# Patient Record
Sex: Male | Born: 1960 | Hispanic: No | Marital: Married | Smoking: Never smoker
Health system: Southern US, Community
[De-identification: ages and names within clinical notes are randomized; demographics above are authoritative.]

## PROBLEM LIST (undated history)

## (undated) DIAGNOSIS — E785 Hyperlipidemia, unspecified: Secondary | ICD-10-CM

## (undated) DIAGNOSIS — E119 Type 2 diabetes mellitus without complications: Secondary | ICD-10-CM

## (undated) DIAGNOSIS — I10 Essential (primary) hypertension: Secondary | ICD-10-CM

## (undated) HISTORY — DX: Hyperlipidemia, unspecified: E78.5

---

## 2014-10-05 ENCOUNTER — Ambulatory Visit
Admission: RE | Admit: 2014-10-05 | Discharge: 2014-10-05 | Disposition: A | Discharge status: Home / Self Care | Payer: Medicaid Other | Source: Ambulatory Visit | Attending: Family Medicine | Admitting: Family Medicine

## 2014-10-05 ENCOUNTER — Other Ambulatory Visit: Payer: Self-pay | Admitting: Family Medicine

## 2014-10-05 DIAGNOSIS — M25541 Pain in joints of right hand: Secondary | ICD-10-CM

## 2016-04-23 LAB — BASIC METABOLIC PANEL: GLUCOSE: 143

## 2016-06-26 DIAGNOSIS — R197 Diarrhea, unspecified: Secondary | ICD-10-CM | POA: Insufficient documentation

## 2016-06-26 DIAGNOSIS — E860 Dehydration: Secondary | ICD-10-CM | POA: Insufficient documentation

## 2016-06-26 DIAGNOSIS — I10 Essential (primary) hypertension: Secondary | ICD-10-CM | POA: Insufficient documentation

## 2016-06-26 DIAGNOSIS — R112 Nausea with vomiting, unspecified: Secondary | ICD-10-CM | POA: Insufficient documentation

## 2016-06-26 DIAGNOSIS — R51 Headache: Secondary | ICD-10-CM | POA: Insufficient documentation

## 2016-06-26 DIAGNOSIS — M545 Low back pain: Secondary | ICD-10-CM | POA: Insufficient documentation

## 2016-06-26 DIAGNOSIS — E119 Type 2 diabetes mellitus without complications: Secondary | ICD-10-CM | POA: Insufficient documentation

## 2016-06-27 ENCOUNTER — Emergency Department (HOSPITAL_COMMUNITY)
Admission: EM | Admit: 2016-06-27 | Discharge: 2016-06-27 | Disposition: A | Discharge status: Home / Self Care | Payer: Medicaid Other | Attending: Emergency Medicine | Admitting: Emergency Medicine

## 2016-06-27 ENCOUNTER — Encounter (HOSPITAL_COMMUNITY): Payer: Self-pay | Admitting: Emergency Medicine

## 2016-06-27 DIAGNOSIS — E860 Dehydration: Secondary | ICD-10-CM

## 2016-06-27 DIAGNOSIS — R112 Nausea with vomiting, unspecified: Secondary | ICD-10-CM

## 2016-06-27 DIAGNOSIS — R197 Diarrhea, unspecified: Secondary | ICD-10-CM

## 2016-06-27 HISTORY — DX: Essential (primary) hypertension: I10

## 2016-06-27 HISTORY — DX: Type 2 diabetes mellitus without complications: E11.9

## 2016-06-27 LAB — CBC
HCT: 39.8 % (ref 39.0–52.0)
HEMOGLOBIN: 13 g/dL (ref 13.0–17.0)
MCH: 26.6 pg (ref 26.0–34.0)
MCHC: 32.7 g/dL (ref 30.0–36.0)
MCV: 81.4 fL (ref 78.0–100.0)
Platelets: 246 10*3/uL (ref 150–400)
RBC: 4.89 MIL/uL (ref 4.22–5.81)
RDW: 12.6 % (ref 11.5–15.5)
WBC: 8.1 10*3/uL (ref 4.0–10.5)

## 2016-06-27 LAB — COMPREHENSIVE METABOLIC PANEL
ALBUMIN: 4.1 g/dL (ref 3.5–5.0)
ALK PHOS: 60 U/L (ref 38–126)
ALT: 25 U/L (ref 17–63)
ANION GAP: 11 (ref 5–15)
AST: 26 U/L (ref 15–41)
BUN: 11 mg/dL (ref 6–20)
CALCIUM: 8.5 mg/dL — AB (ref 8.9–10.3)
CHLORIDE: 98 mmol/L — AB (ref 101–111)
CO2: 23 mmol/L (ref 22–32)
Creatinine, Ser: 0.77 mg/dL (ref 0.61–1.24)
GFR calc non Af Amer: >60 mL/min (ref >60)
GLUCOSE: 222 mg/dL — AB (ref 65–99)
Potassium: 4 mmol/L (ref 3.5–5.1)
Sodium: 132 mmol/L — ABNORMAL LOW (ref 135–145)
Total Bilirubin: 0.7 mg/dL (ref 0.3–1.2)
Total Protein: 6.9 g/dL (ref 6.5–8.1)

## 2016-06-27 LAB — URINALYSIS, ROUTINE W REFLEX MICROSCOPIC
BACTERIA UA: NONE SEEN
Bilirubin Urine: NEGATIVE
GLUCOSE, UA: NEGATIVE mg/dL
Hgb urine dipstick: NEGATIVE
KETONES UR: 20 mg/dL — AB
Leukocytes, UA: NEGATIVE
Nitrite: NEGATIVE
PROTEIN: 30 mg/dL — AB
SQUAMOUS EPITHELIAL / LPF: NONE SEEN
Specific Gravity, Urine: 1.027 (ref 1.005–1.030)
pH: 6 (ref 5.0–8.0)

## 2016-06-27 LAB — LIPASE, BLOOD: LIPASE: 55 U/L — AB (ref 11–51)

## 2016-06-27 MED ORDER — ONDANSETRON HCL 4 MG/2ML IJ SOLN
4.0000 mg | Freq: Once | INTRAMUSCULAR | Status: AC
  Administered 2016-06-27: 4 mg via INTRAVENOUS
  Filled 2016-06-27: qty 2

## 2016-06-27 MED ORDER — MORPHINE SULFATE (PF) 4 MG/ML IV SOLN
4.0000 mg | Freq: Once | INTRAVENOUS | Status: AC
  Administered 2016-06-27: 4 mg via INTRAVENOUS
  Filled 2016-06-27: qty 1

## 2016-06-27 MED ORDER — KETOROLAC TROMETHAMINE 30 MG/ML IJ SOLN
30.0000 mg | Freq: Once | INTRAMUSCULAR | Status: AC
  Administered 2016-06-27: 30 mg via INTRAVENOUS
  Filled 2016-06-27: qty 1

## 2016-06-27 MED ORDER — ONDANSETRON HCL 4 MG/2ML IJ SOLN: 4.0000 mg | Freq: Once | INTRAMUSCULAR | Status: DC

## 2016-06-27 MED ORDER — MORPHINE SULFATE (PF) 4 MG/ML IV SOLN: 4.0000 mg | Freq: Once | INTRAVENOUS | Status: DC

## 2016-06-27 MED ORDER — ONDANSETRON 8 MG PO TBDP: 8.0000 mg | ORAL_TABLET | Freq: Three times a day (TID) | ORAL | 0 refills | Status: DC | PRN

## 2016-06-27 MED ORDER — SODIUM CHLORIDE 0.9 % IV BOLUS (SEPSIS)
1000.0000 mL | Freq: Once | INTRAVENOUS | Status: AC
  Administered 2016-06-27: 1000 mL via INTRAVENOUS

## 2016-06-27 NOTE — ED Provider Notes (Signed)
[facility]-EMERGENCY DEPT Provider Note   CSN: [id number] Arrival date & time: 06/26/16  2356  By signing my name below, I, Thelma Barge, attest that this documentation has been prepared under the direction and in the presence of Azalia Bilis, MD. Electronically Signed: Thelma Barge, Scribe. 06/27/16. 2:11 AM.  History   Chief Complaint Chief Complaint  Patient presents with  . Abdominal Pain  . Emesis  . Diarrhea  . Headache  . Back Pain   The history is provided by the patient. No language interpreter was used.    HPI Comments: Todd Barnes is a 56 y.o. male who presents to the Emergency Department complaining of constant, gradually worsening abdominal pain since today. He has associated vomiting, diarrhea, back pain, and headache. He notes the pain is worse when he goes to the bathroom. He denies cough and hematemesis.  Past Medical History:  Diagnosis Date  . Diabetes mellitus without complication (HCC)   . Hypertension     There are no active problems to display for this patient.   History reviewed. No pertinent surgical history.     Home Medications    None  Family History No family history on file.  Social History Social History  Substance Use Topics  . Smoking status: Never Smoker  . Smokeless tobacco: Never Used  . Alcohol use No     Allergies   Patient has no known allergies.   Review of Systems Review of Systems  Respiratory: Negative for cough.   Gastrointestinal: Positive for abdominal pain, diarrhea, nausea and vomiting.  Musculoskeletal: Positive for back pain.  Neurological: Positive for headaches.  All other systems reviewed and are negative.    Physical Exam Updated Vital Signs BP 130/73   Pulse 99   Temp 99.6 F (37.6 C) (Oral)   Resp 16   SpO2 99%   Physical Exam  Constitutional: He is oriented to person, place, and time. He appears well-developed and well-nourished.  HENT:  Head: Normocephalic and atraumatic.  Eyes:  EOM are normal.  Neck: Normal range of motion.  Cardiovascular: Normal rate, regular rhythm, normal heart sounds and intact distal pulses.   Pulmonary/Chest: Effort normal and breath sounds normal. No respiratory distress.  Abdominal: Soft. He exhibits no distension. There is no tenderness.  Musculoskeletal: Normal range of motion.  Neurological: He is alert and oriented to person, place, and time.  Skin: Skin is warm and dry.  Psychiatric: He has a normal mood and affect. Judgment normal.  Nursing note and vitals reviewed.    ED Treatments / Results  DIAGNOSTIC STUDIES: Oxygen Saturation is 99% on RA, normal by my interpretation.    COORDINATION OF CARE: 2:10 AM Discussed treatment plan with pt at bedside and pt agreed to plan.  Labs (all labs ordered are listed, but only abnormal results are displayed) Labs Reviewed  LIPASE, BLOOD - Abnormal; Notable for the following:       Result Value   Lipase 55 (*)    All other components within normal limits  COMPREHENSIVE METABOLIC PANEL - Abnormal; Notable for the following:    Sodium 132 (*)    Chloride 98 (*)    Glucose, Bld 222 (*)    Calcium 8.5 (*)    All other components within normal limits  URINALYSIS, ROUTINE W REFLEX MICROSCOPIC - Abnormal; Notable for the following:    Ketones, ur 20 (*)    Protein, ur 30 (*)    All other components within normal limits  CBC  EKG  EKG Interpretation None       Radiology No results found.  Procedures Procedures (including critical care time)  Medications Ordered in ED Medications  sodium chloride 0.9 % bolus 1,000 mL (0 mLs Intravenous Stopped 06/27/16 0429)  ondansetron (ZOFRAN) injection 4 mg (4 mg Intravenous Given 06/27/16 0222)  ketorolac (TORADOL) 30 MG/ML injection 30 mg (30 mg Intravenous Given 06/27/16 0222)  morphine 4 MG/ML injection 4 mg (4 mg Intravenous Given 06/27/16 0222)     Initial Impression / Assessment and Plan / ED Course  I have reviewed the  triage vital signs and the nursing notes.  Pertinent labs & imaging results that were available during my care of the patient were reviewed by me and considered in my medical decision making (see chart for details).     Feels much better after IV fluids.  Likely viral illness given nausea vomiting diarrhea.  No abdominal tenderness on examination.  Repeat abdominal exam without tenderness  Final Clinical Impressions(s) / ED Diagnoses   Final diagnoses:  Nausea vomiting and diarrhea  Dehydration    New Prescriptions New Prescriptions   ONDANSETRON (ZOFRAN ODT) 8 MG DISINTEGRATING TABLET    Take 1 tablet (8 mg total) by mouth every 8 (eight) hours as needed for nausea or vomiting.  I personally performed the services described in this documentation, which was scribed in my presence. The recorded information has been reviewed and is accurate.        Azalia Bilisampos, Taylorann Tkach, MD 06/27/16 [id number]

## 2016-06-27 NOTE — ED Triage Notes (Signed)
Patient presents with multiple complaints : Mid abdominal pain with emesis / diarrhea today , fatigue , headache and low back pain , denies injury , no fever or chills . Arabic interpreter service utilized during triage .

## 2016-07-27 ENCOUNTER — Encounter: Payer: Self-pay | Admitting: Internal Medicine

## 2016-07-27 ENCOUNTER — Ambulatory Visit: Payer: Self-pay | Attending: Internal Medicine | Admitting: Internal Medicine

## 2016-07-27 VITALS — BP 158/94 | HR 88 | Temp 98.7°F | Wt 180.4 lb

## 2016-07-27 DIAGNOSIS — Z1211 Encounter for screening for malignant neoplasm of colon: Secondary | ICD-10-CM | POA: Insufficient documentation

## 2016-07-27 DIAGNOSIS — E785 Hyperlipidemia, unspecified: Secondary | ICD-10-CM | POA: Insufficient documentation

## 2016-07-27 DIAGNOSIS — IMO0001 Reserved for inherently not codable concepts without codable children: Secondary | ICD-10-CM

## 2016-07-27 DIAGNOSIS — E1165 Type 2 diabetes mellitus with hyperglycemia: Secondary | ICD-10-CM | POA: Insufficient documentation

## 2016-07-27 DIAGNOSIS — I10 Essential (primary) hypertension: Secondary | ICD-10-CM | POA: Insufficient documentation

## 2016-07-27 DIAGNOSIS — Z1159 Encounter for screening for other viral diseases: Secondary | ICD-10-CM | POA: Insufficient documentation

## 2016-07-27 DIAGNOSIS — Z23 Encounter for immunization: Secondary | ICD-10-CM | POA: Insufficient documentation

## 2016-07-27 LAB — GLUCOSE, POCT (MANUAL RESULT ENTRY): POC Glucose: 213 mg/dl — AB (ref 70–99)

## 2016-07-27 LAB — POCT GLYCOSYLATED HEMOGLOBIN (HGB A1C): Hemoglobin A1C: 9.1

## 2016-07-27 MED ORDER — SITAGLIPTIN PHOSPHATE 50 MG PO TABS: 50.0000 mg | ORAL_TABLET | Freq: Every day | ORAL | 6 refills | Status: DC

## 2016-07-27 MED ORDER — LISINOPRIL 10 MG PO TABS: 15.0000 mg | ORAL_TABLET | Freq: Every day | ORAL | 6 refills | Status: DC

## 2016-07-27 NOTE — Patient Instructions (Addendum)
Please have pt see Ms. Leavy Cella today before leaving. He has complete [employer] card form with him.   Increase lisinopril to 15 mg daily. Your blood sugar is not well controlled. We have added a medication called Januvia to take once a day  Continue healthy eating habits and regular exercise. Pneumococcal Polysaccharide Vaccine: What You Need to Know 1. Why get vaccinated? Vaccination can protect older adults (and some children and younger adults) from pneumococcal disease. Pneumococcal disease is caused by bacteria that can spread from person to person through close contact. It can cause ear infections, and it can also lead to more serious infections of the:  Lungs (pneumonia),  Blood (bacteremia), and  Covering of the brain and spinal cord (meningitis). Meningitis can cause deafness and brain damage, and it can be fatal.  Anyone can get pneumococcal disease, but children under 39 years of age, people with certain medical conditions, adults over 8 years of age, and cigarette smokers are at the highest risk. About 18,000 older adults die each year from pneumococcal disease in the [place]. Treatment of pneumococcal infections with penicillin and other drugs used to be more effective. But some strains of the disease have become resistant to these drugs. This makes prevention of the disease, through vaccination, even more important. 2. Pneumococcal polysaccharide vaccine (PPSV23) Pneumococcal polysaccharide vaccine (PPSV23) protects against 23 types of pneumococcal bacteria. It will not prevent all pneumococcal disease. PPSV23 is recommended for:  All adults 55 years of age and older,  Anyone 2 through 54 years of age with certain long-term health problems,  Anyone 2 through 54 years of age with a weakened immune system,  Adults 79 through 54 years of age who smoke cigarettes or have asthma.  Most people need only one dose of PPSV. A second dose is recommended for certain high-risk  groups. People 55 and older should get a dose even if they have gotten one or more doses of the vaccine before they turned 65. Your healthcare provider can give you more information about these recommendations. Most healthy adults develop protection within 2 to 3 weeks of getting the shot. 3. Some people should not get this vaccine  Anyone who has had a life-threatening allergic reaction to PPSV should not get another dose.  Anyone who has a severe allergy to any component of PPSV should not receive it. Tell your provider if you have any severe allergies.  Anyone who is moderately or severely ill when the shot is scheduled may be asked to wait until they recover before getting the vaccine. Someone with a mild illness can usually be vaccinated.  Children less than 34 years of age should not receive this vaccine.  There is no evidence that PPSV is harmful to either a pregnant woman or to her fetus. However, as a precaution, women who need the vaccine should be vaccinated before becoming pregnant, if possible. 4. Risks of a vaccine reaction With any medicine, including vaccines, there is a chance of side effects. These are usually mild and go away on their own, but serious reactions are also possible. About half of people who get PPSV have mild side effects, such as redness or pain where the shot is given, which go away within about two days. Less than 1 out of 100 people develop a fever, muscle aches, or more severe local reactions. Problems that could happen after any vaccine:  People sometimes faint after a medical procedure, including vaccination. Sitting or lying down for about 15 minutes  can help prevent fainting, and injuries caused by a fall. Tell your doctor if you feel dizzy, or have vision changes or ringing in the ears.  Some people get severe pain in the shoulder and have difficulty moving the arm where a shot was given. This happens very rarely.  Any medication can cause a severe  allergic reaction. Such reactions from a vaccine are very rare, estimated at about 1 in a million doses, and would happen within a few minutes to a few hours after the vaccination. As with any medicine, there is a very remote chance of a vaccine causing a serious injury or death. The safety of vaccines is always being monitored. For more information, visit: http://floyd.org/ 5. What if there is a serious reaction? What should I look for? Look for anything that concerns you, such as signs of a severe allergic reaction, very high fever, or unusual behavior. Signs of a severe allergic reaction can include hives, swelling of the face and throat, difficulty breathing, a fast heartbeat, dizziness, and weakness. These would usually start a few minutes to a few hours after the vaccination. What should I do? If you think it is a severe allergic reaction or other emergency that can't wait, call 9-1-1 or get to the nearest hospital. Otherwise, call your doctor. Afterward, the reaction should be reported to the Vaccine Adverse Event Reporting System (VAERS). Your doctor might file this report, or you can do it yourself through the VAERS web site at www.vaers.LAgents.no, or by calling 1-704-413-9903. VAERS does not give medical advice. 6. How can I learn more?  Ask your doctor. He or she can give you the vaccine package insert or suggest other sources of information.  Call your local or state health department.  Contact the Centers for Disease Control and Prevention (CDC): ? Call (503)551-3174 (1-800-CDC-INFO) or ? Visit CDC's website at PicCapture.uy CDC Pneumococcal Polysaccharide Vaccine VIS (04/24/13) This information is not intended to replace advice given to you by your health care provider. Make sure you discuss any questions you have with your health care provider. Document Released: 10/15/2005 Document Revised: 09/08/2015 Document Reviewed: 09/08/2015 Elsevier Interactive Patient  Education  2017 Elsevier Inc. Td Vaccine (Tetanus and Diphtheria): What You Need to Know 1. Why get vaccinated? Tetanus  and diphtheria are very serious diseases. They are rare in the [place] today, but people who do become infected often have severe complications. Td vaccine is used to protect adolescents and adults from both of these diseases. Both tetanus and diphtheria are infections caused by bacteria. Diphtheria spreads from person to person through coughing or sneezing. Tetanus-causing bacteria enter the body through cuts, scratches, or wounds. TETANUS (lockjaw) causes painful muscle tightening and stiffness, usually all over the body.  It can lead to tightening of muscles in the head and neck so you can't open your mouth, swallow, or sometimes even breathe. Tetanus kills about 1 out of every 10 people who are infected even after receiving the best medical care.  DIPHTHERIA can cause a thick coating to form in the back of the throat.  It can lead to breathing problems, paralysis, heart failure, and death.  Before vaccines, as many as 200,000 cases of diphtheria and hundreds of cases of tetanus were reported in the [place] each year. Since vaccination began, reports of cases for both diseases have dropped by about 99%. 2. Td vaccine Td vaccine can protect adolescents and adults from tetanus and diphtheria. Td is usually given as a booster dose  every 10 years but it can also be given earlier after a severe and dirty wound or burn. Another vaccine, called Tdap, which protects against pertussis in addition to tetanus and diphtheria, is sometimes recommended instead of Td vaccine. Your doctor or the person giving you the vaccine can give you more information. Td may safely be given at the same time as other vaccines. 3. Some people should not get this vaccine  A person who has ever had a life-threatening allergic reaction after a previous dose of any tetanus or diphtheria  containing vaccine, OR has a severe allergy to any part of this vaccine, should not get Td vaccine. Tell the person giving the vaccine about any severe allergies.  Talk to your doctor if you: ? had severe pain or swelling after any vaccine containing diphtheria or tetanus, ? ever had a condition called Guillain Barre Syndrome (GBS), ? aren't feeling well on the day the shot is scheduled. 4. What are the risks from Td vaccine? With any medicine, including vaccines, there is a chance of side effects. These are usually mild and go away on their own. Serious reactions are also possible but are rare. Most people who get Td vaccine do not have any problems with it. Mild problems following Td vaccine: (Did not interfere with activities)  Pain where the shot was given (about 8 people in 10)  Redness or swelling where the shot was given (about 1 person in 4)  Mild fever (rare)  Headache (about 1 person in 4)  Tiredness (about 1 person in 4)  Moderate problems following Td vaccine: (Interfered with activities, but did not require medical attention)  Fever over 102F (rare)  Severe problems following Td vaccine: (Unable to perform usual activities; required medical attention)  Swelling, severe pain, bleeding and/or redness in the arm where the shot was given (rare).  Problems that could happen after any vaccine:  People sometimes faint after a medical procedure, including vaccination. Sitting or lying down for about 15 minutes can help prevent fainting, and injuries caused by a fall. Tell your doctor if you feel dizzy, or have vision changes or ringing in the ears.  Some people get severe pain in the shoulder and have difficulty moving the arm where a shot was given. This happens very rarely.  Any medication can cause a severe allergic reaction. Such reactions from a vaccine are very rare, estimated at fewer than 1 in a million doses, and would happen within a few minutes to a few hours  after the vaccination. As with any medicine, there is a very remote chance of a vaccine causing a serious injury or death. The safety of vaccines is always being monitored. For more information, visit: http://floyd.org/www.cdc.gov/vaccinesafety/ 5. What if there is a serious reaction? What should I look for? Look for anything that concerns you, such as signs of a severe allergic reaction, very high fever, or unusual behavior. Signs of a severe allergic reaction can include hives, swelling of the face and throat, difficulty breathing, a fast heartbeat, dizziness, and weakness. These would usually start a few minutes to a few hours after the vaccination. What should I do?  If you think it is a severe allergic reaction or other emergency that can't wait, call 9-1-1 or get the person to the nearest hospital. Otherwise, call your doctor.  Afterward, the reaction should be reported to the Vaccine Adverse Event Reporting System (VAERS). Your doctor might file this report, or you can do it yourself through the  VAERS web site at www.vaers.LAgents.nohhs.gov, or by calling 1-548-244-2659. ? VAERS does not give medical advice. 6. The National Vaccine Injury Compensation Program The [employer] Vaccine Injury Compensation Program (VICP) is a federal program that was created to compensate people who may have been injured by certain vaccines. Persons who believe they may have been injured by a vaccine can learn about the program and about filing a claim by calling 1-310 134 7381 or visiting the VICP website at SpiritualWord.atwww.hrsa.gov/vaccinecompensation. There is a time limit to file a claim for compensation. 7. How can I learn more?  Ask your doctor. He or she can give you the vaccine package insert or suggest other sources of information.  Call your local or state health department.  Contact the Centers for Disease Control and Prevention (CDC): ? Call 856-401-43051-(705)275-4626 (1-800-CDC-INFO) ? Visit CDC's website at PicCapture.uywww.cdc.gov/vaccines CDC Td Vaccine  VIS (04/12/15) This information is not intended to replace advice given to you by your health care provider. Make sure you discuss any questions you have with your health care provider. Document Released: 10/15/2005 Document Revised: 09/08/2015 Document Reviewed: 09/08/2015 Elsevier Interactive Patient Education  2017 [employer]

## 2016-07-27 NOTE — Progress Notes (Signed)
Patient ID: Todd Barnes, male    DOB: Aug 05, 1960  MRN: [id number]  CC: No chief complaint on file.   Subjective: Todd Barnes is a 56 y.o. male who presents for new pt visit. 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  PCP was in [place] at a community health clinic  His concerns today include:  Hx DM, HTN and HL  1. DM BS range 135-140 in a.ms Eating habits: Does not cook as much but tries to eat healthy- Chicken, fish, veggies, fruits, wheat bread Exercise: walks and runs 3 x a wk.  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 No retinopathy. Wears reading glasses -no numbness -compliant with Metformin, and Amaryl  2. HTN -compliant with Lisinopril 10 mg -checks BP daily: SBP range 130s. Sometimes in 150s. -limits salt in foods -no CP/SOB/LE edema  3. HL -tolerating Lovastatin  HM: never had colonoscopy, Tdap and Pneumovax  No current outpatient prescriptions on file prior to visit.   No current facility-administered medications on file prior to visit.     No Known Allergies  Social History   Social History  . Marital status: Married    Spouse name: N/A  . Number of children: N/A  . Years of education: 5016   Occupational History  . house keeping    Social History Main Topics  . Smoking status: Never Smoker  . Smokeless tobacco: Never Used  . Alcohol use No  . Drug use: No  . Sexual activity: No   Other Topics Concern  . Not on file   Social History Narrative  . No narrative on file    Family History  Problem Relation Age of Onset  . Diabetes Mother     History reviewed. No pertinent surgical history.  ROS: Review of Systems  Constitutional: Negative for appetite change and fatigue.  Eyes: Negative for visual disturbance.  Respiratory: Negative for cough and shortness of breath.   Cardiovascular: Negative for chest pain, palpitations and leg swelling.    Gastrointestinal: Negative for abdominal pain.  Genitourinary: Negative for difficulty urinating.  Psychiatric/Behavioral: Negative for dysphoric mood.    PHYSICAL EXAM: BP (!) 158/94   Pulse 88   Temp 98.7 F (37.1 C) (Oral)   Wt 180 lb 6.4 oz (81.8 kg)   SpO2 99%   Physical Exam Constitutional: Appears well-developed and well-nourished. No distress. Head: Normocephalic. Atraumatic Eyes: Conjunctivae and EOM are normal. PERRLA, no scleral icterus.  Mouth: no oral lesions, good oral hygiene, throat clear without exudates Neck: Neck supple.  No tracheal deviation. No thyromegaly. No cervical LN CVS: RRR, S1/S2 +, no murmurs, no gallops, no carotid bruit. No JVD Pulmonary: Effort and breath sounds normal, no stridor, rhonchi, wheezes, rales.  Musculoskeletal: Normal range of motion. No edema and no tenderness.  Neuro: Alert and oriented x3.  Cns grossly intact, Power: 5/5 BL in all 4s.  Reflexes normal.  Normal  muscle tone, coordination. . Skin: Skin is warm and dry. No rash noted. Not diaphoretic. No erythema. No pallor.  Psychiatric: Normal mood and affect. Behavior, judgment, thought content normal.  Diabetic Foot Exam - Simple   Simple Foot Form Visual Inspection No deformities, no ulcerations, no other skin breakdown bilaterally:  Yes Sensation Testing Intact to touch and monofilament testing bilaterally:  Yes Pulse Check Posterior Tibialis and Dorsalis pulse intact bilaterally:  Yes Comments     Depression  screen PHQ 2/9 07/27/2016  Decreased Interest 0  Down, Depressed, Hopeless 0  PHQ - 2 Score 0   GAD 7 : Generalized Anxiety Score 07/27/2016  Nervous, Anxious, on Edge 0  Control/stop worrying 0  Worry too much - different things 0  Trouble relaxing 0  Restless 0  Easily annoyed or irritable 0  Afraid - awful might happen 0  Total GAD 7 Score 0    ASSESSMENT AND PLAN: 1. Uncontrolled type 2 diabetes mellitus without complication, without long-term current  use of insulin (HCC) -Recommend starting daily long-acting insulin. Patient declined and would prefer another oral agent. Start Januvia. Continue to encourage healthy eating and regular exercise. Last eye exam was 6 months ago by his report - POCT glucose (manual entry) - POCT glycosylated hemoglobin (Hb A1C) - Microalbumin/Creatinine Ratio, Urine - sitaGLIPtin (JANUVIA) 50 MG tablet; Take 1 tablet (50 mg total) by mouth daily.  Dispense: 30 tablet; Refill: 6 - CBC - Comprehensive metabolic panel - Lipid panel  2. Essential hypertension -Not at goal. Increase lisinopril to 15 mg daily. DASH diet discussed. - lisinopril (PRINIVIL,ZESTRIL) 10 MG tablet; Take 1.5 tablets (15 mg total) by mouth daily.  Dispense: 45 tablet; Refill: 6  3. Hyperlipidemia, unspecified hyperlipidemia type Continue lovastatin.  4. Colon cancer screening - Fecal occult blood, imunochemical  5. Need for Tdap vaccination - Tdap vaccine greater than or equal to 7yo IM  6. Need for vaccination for Strep pneumoniae - Pneumococcal polysaccharide vaccine 23-valent greater than or equal to 2yo subcutaneous/IM  7. Need for hepatitis C screening test - Hepatitis c antibody (reflex)   Patient was given the opportunity to ask questions.  Patient verbalized understanding of the plan and was able to repeat key elements of the plan.   Orders Placed This Encounter  Procedures  . Fecal occult blood, imunochemical  . Tdap vaccine greater than or equal to 7yo IM  . Pneumococcal polysaccharide vaccine 23-valent greater than or equal to 2yo subcutaneous/IM  . Microalbumin/Creatinine Ratio, Urine  . CBC  . Comprehensive metabolic panel  . Lipid panel  . Hepatitis c antibody (reflex)  . POCT glucose (manual entry)  . POCT glycosylated hemoglobin (Hb A1C)     Requested Prescriptions   Signed Prescriptions Disp Refills  . sitaGLIPtin (JANUVIA) 50 MG tablet 30 tablet 6    Sig: Take 1 tablet (50 mg total) by mouth  daily.  Marland Kitchen. lisinopril (PRINIVIL,ZESTRIL) 10 MG tablet 45 tablet 6    Sig: Take 1.5 tablets (15 mg total) by mouth daily.    Return in about 3 months (around 10/27/2016).  Jonah Blueeborah Johnson, MD, FACP

## 2016-07-28 LAB — COMPREHENSIVE METABOLIC PANEL
A/G RATIO: 1.7 (ref 1.2–2.2)
ALT: 12 IU/L (ref 0–44)
AST: 15 IU/L (ref 0–40)
Albumin: 4.7 g/dL (ref 3.5–5.5)
Alkaline Phosphatase: 77 IU/L (ref 39–117)
BILIRUBIN TOTAL: 0.2 mg/dL (ref 0.0–1.2)
BUN/Creatinine Ratio: 19 (ref 9–20)
BUN: 16 mg/dL (ref 6–24)
CHLORIDE: 101 mmol/L (ref 96–106)
CO2: 23 mmol/L (ref 20–29)
Calcium: 9.7 mg/dL (ref 8.7–10.2)
Creatinine, Ser: 0.84 mg/dL (ref 0.76–1.27)
GFR, EST AFRICAN AMERICAN: 115 mL/min/{1.73_m2} (ref >59)
GFR, EST NON AFRICAN AMERICAN: 99 mL/min/{1.73_m2} (ref >59)
GLOBULIN, TOTAL: 2.8 g/dL (ref 1.5–4.5)
Glucose: 197 mg/dL — ABNORMAL HIGH (ref 65–99)
POTASSIUM: 4.8 mmol/L (ref 3.5–5.2)
SODIUM: 139 mmol/L (ref 134–144)
TOTAL PROTEIN: 7.5 g/dL (ref 6.0–8.5)

## 2016-07-28 LAB — CBC
HEMATOCRIT: 41.1 % (ref 37.5–51.0)
Hemoglobin: 13.8 g/dL (ref 13.0–17.7)
MCH: 27.5 pg (ref 26.6–33.0)
MCHC: 33.6 g/dL (ref 31.5–35.7)
MCV: 82 fL (ref 79–97)
PLATELETS: 270 10*3/uL (ref 150–379)
RBC: 5.02 x10E6/uL (ref 4.14–5.80)
RDW: 13.9 % (ref 12.3–15.4)
WBC: 5.4 10*3/uL (ref 3.4–10.8)

## 2016-07-28 LAB — MICROALBUMIN / CREATININE URINE RATIO
CREATININE, UR: 59 mg/dL
Microalb/Creat Ratio: 6.1 mg/g creat (ref 0.0–30.0)
Microalbumin, Urine: 3.6 ug/mL

## 2016-07-28 LAB — HEPATITIS C ANTIBODY (REFLEX)

## 2016-07-28 LAB — LIPID PANEL
CHOL/HDL RATIO: 3.2 ratio (ref 0.0–5.0)
Cholesterol, Total: 118 mg/dL (ref 100–199)
HDL: 37 mg/dL — AB (ref >39)
LDL Calculated: 55 mg/dL (ref 0–99)
Triglycerides: 131 mg/dL (ref 0–149)
VLDL Cholesterol Cal: 26 mg/dL (ref 5–40)

## 2016-07-28 LAB — HCV COMMENT:

## 2016-07-31 ENCOUNTER — Ambulatory Visit: Payer: Self-pay | Attending: Internal Medicine

## 2016-08-06 ENCOUNTER — Telehealth: Payer: Self-pay

## 2016-08-06 NOTE — Telephone Encounter (Signed)
Contacted pt to go over lab results pt didn't answer lvm asking pt to give me a call at his earliest convenience   If pt calls back please give results: kidney and liver function tests, cholesterol levels are normal. Hep C screen is negative.

## 2016-08-10 ENCOUNTER — Ambulatory Visit: Payer: Self-pay | Attending: Internal Medicine

## 2016-08-29 ENCOUNTER — Ambulatory Visit: Payer: Medicaid Other

## 2016-09-05 ENCOUNTER — Ambulatory Visit: Payer: Self-pay | Attending: Internal Medicine

## 2016-10-03 ENCOUNTER — Ambulatory Visit: Payer: Self-pay

## 2016-10-05 ENCOUNTER — Ambulatory Visit: Payer: Self-pay

## 2016-10-18 ENCOUNTER — Ambulatory Visit: Payer: Self-pay | Attending: Internal Medicine

## 2016-10-19 ENCOUNTER — Ambulatory Visit: Payer: Medicaid Other | Attending: Internal Medicine

## 2016-10-26 ENCOUNTER — Other Ambulatory Visit: Payer: Self-pay

## 2016-10-26 DIAGNOSIS — IMO0001 Reserved for inherently not codable concepts without codable children: Secondary | ICD-10-CM

## 2016-10-26 DIAGNOSIS — E1165 Type 2 diabetes mellitus with hyperglycemia: Principal | ICD-10-CM

## 2016-10-26 MED ORDER — SITAGLIPTIN PHOSPHATE 50 MG PO TABS: 50.0000 mg | ORAL_TABLET | Freq: Every day | ORAL | 3 refills | Status: DC

## 2016-11-01 ENCOUNTER — Encounter: Payer: Self-pay | Admitting: Internal Medicine

## 2016-11-01 DIAGNOSIS — E785 Hyperlipidemia, unspecified: Secondary | ICD-10-CM | POA: Insufficient documentation

## 2016-11-01 DIAGNOSIS — E119 Type 2 diabetes mellitus without complications: Secondary | ICD-10-CM | POA: Insufficient documentation

## 2016-11-01 DIAGNOSIS — I10 Essential (primary) hypertension: Secondary | ICD-10-CM | POA: Insufficient documentation

## 2016-11-01 LAB — CBC AND DIFFERENTIAL: HEMOGLOBIN: 8.4 — AB (ref 13.5–17.5)

## 2017-01-09 ENCOUNTER — Ambulatory Visit: Payer: Medicaid Other | Attending: Internal Medicine

## 2017-03-12 ENCOUNTER — Ambulatory Visit: Payer: Self-pay

## 2017-04-04 ENCOUNTER — Encounter: Payer: Self-pay | Admitting: Internal Medicine

## 2017-04-04 ENCOUNTER — Ambulatory Visit: Payer: Self-pay | Attending: Internal Medicine | Admitting: Internal Medicine

## 2017-04-04 VITALS — BP 172/99 | HR 89 | Temp 98.8°F | Resp 16 | Wt 181.2 lb

## 2017-04-04 DIAGNOSIS — E119 Type 2 diabetes mellitus without complications: Secondary | ICD-10-CM

## 2017-04-04 DIAGNOSIS — Z79899 Other long term (current) drug therapy: Secondary | ICD-10-CM | POA: Insufficient documentation

## 2017-04-04 DIAGNOSIS — Z1211 Encounter for screening for malignant neoplasm of colon: Secondary | ICD-10-CM

## 2017-04-04 DIAGNOSIS — I10 Essential (primary) hypertension: Secondary | ICD-10-CM

## 2017-04-04 DIAGNOSIS — E785 Hyperlipidemia, unspecified: Secondary | ICD-10-CM

## 2017-04-04 DIAGNOSIS — Z7982 Long term (current) use of aspirin: Secondary | ICD-10-CM | POA: Insufficient documentation

## 2017-04-04 LAB — GLUCOSE, POCT (MANUAL RESULT ENTRY): POC Glucose: 186 mg/dL — AB (ref 70–99)

## 2017-04-04 LAB — POCT GLYCOSYLATED HEMOGLOBIN (HGB A1C): Hemoglobin A1C: 7.4

## 2017-04-04 MED ORDER — METFORMIN HCL 1000 MG PO TABS: 1000.0000 mg | ORAL_TABLET | Freq: Two times a day (BID) | ORAL | 2 refills | Status: DC

## 2017-04-04 MED ORDER — ATENOLOL 50 MG PO TABS: 50.0000 mg | ORAL_TABLET | Freq: Every day | ORAL | 2 refills | Status: DC

## 2017-04-04 MED ORDER — LOVASTATIN 40 MG PO TABS: 40.0000 mg | ORAL_TABLET | Freq: Every day | ORAL | 2 refills | Status: DC

## 2017-04-04 MED ORDER — GLIMEPIRIDE 4 MG PO TABS: 4.0000 mg | ORAL_TABLET | Freq: Two times a day (BID) | ORAL | 2 refills | Status: DC

## 2017-04-04 MED ORDER — SITAGLIPTIN PHOSPHATE 50 MG PO TABS: 50.0000 mg | ORAL_TABLET | Freq: Every day | ORAL | 3 refills | Status: DC

## 2017-04-04 MED ORDER — LISINOPRIL 20 MG PO TABS: 20.0000 mg | ORAL_TABLET | Freq: Every day | ORAL | 2 refills | Status: DC

## 2017-04-04 NOTE — Progress Notes (Signed)
Patient ID: Todd Barnes, male    DOB: [birth year]  MRN: [id number]  CC: Diabetes   Subjective: Todd Barnes is a 57 y.o. male who presents for chronic disease management His concerns today include:  Hx DM, HTN and HL  1.  DM:   Med: complaint with all 3 oral meds BS: checks BS several times a wk.  Before meals range 80-90; after meal 180-190 Diet:  Doing okay with eating habits.  Avoids sugary drinks Walks 30-60 mins 5 days a wk Due for eye exam.  No blurred vision No numbness in hands/feet  2.  HTN: check BP 3-4 x/wk.  Gives range of  SBP 130-140.  Compliant with Lisinopril and Atenolol.  NO CP/SOB/LE edema  3.  FIT: Did not to test as yet.  He was confused about the instructions.  No family history of colon cancer.  Patient Active Problem List   Diagnosis Date Noted  . Controlled type 2 diabetes mellitus without complication, without long-term current use of insulin (HCC) 11/01/2016  . Essential hypertension 11/01/2016  . Hyperlipidemia 11/01/2016     Current Outpatient Medications on File Prior to Visit  Medication Sig Dispense Refill  . aspirin EC 81 MG tablet Take 81 mg by mouth daily.     No current facility-administered medications on file prior to visit.     No Known Allergies  Social History   Socioeconomic History  . Marital status: Married    Spouse name: Not on file  . Number of children: Not on file  . Years of education: 22  . Highest education level: Not on file  Occupational History  . Occupation: house keeping  Social Needs  . Financial resource strain: Not on file  . Food insecurity:    Worry: Not on file    Inability: Not on file  . Transportation needs:    Medical: Not on file    Non-medical: Not on file  Tobacco Use  . Smoking status: Never Smoker  . Smokeless tobacco: Never Used  Substance and Sexual Activity  . Alcohol use: No  . Drug use: No  . Sexual activity: Never  Lifestyle  . Physical activity:    Days per  week: Not on file    Minutes per session: Not on file  . Stress: Not on file  Relationships  . Social connections:    Talks on phone: Not on file    Gets together: Not on file    Attends religious service: Not on file    Active member of club or organization: Not on file    Attends meetings of clubs or organizations: Not on file    Relationship status: Not on file  . Intimate partner violence:    Fear of current or ex partner: Not on file    Emotionally abused: Not on file    Physically abused: Not on file    Forced sexual activity: Not on file  Other Topics Concern  . Not on file  Social History Narrative  . Not on file    Family History  Problem Relation Age of Onset  . Diabetes Mother     No past surgical history on file.  ROS: Review of Systems Negative except as stated above PHYSICAL EXAM: BP (!) 172/99   Pulse 89   Temp 98.8 F (37.1 C) (Oral)   Resp 16   Wt 181 lb 3.2 oz (82.2 kg)   SpO2 99%   Wt Readings from Last 3  Encounters:  04/04/17 181 lb 3.2 oz (82.2 kg)  07/27/16 180 lb 6.4 oz (81.8 kg)  Repeat 160/90  Physical Exam  General appearance - alert, well appearing, and in no distress Mental status - alert, oriented to person, place, and time, normal mood, behavior, speech, dress, motor activity, and thought processes Neck - supple, no significant adenopathy Chest - clear to auscultation, no wheezes, rales or rhonchi, symmetric air entry Heart - normal rate, regular rhythm, normal S1, S2, no murmurs, rubs, clicks or gallops Extremities - peripheral pulses normal, no pedal edema, no clubbing or cyanosis  BS 186/A1C 7.4  ASSESSMENT AND PLAN: 1. Controlled type 2 diabetes mellitus without complication, without long-term current use of insulin (HCC) Much improved since last visit.  Continue metformin, Amaryl, and Januvia.  Continue healthy eating habits and regular exercise Patient encouraged to have eye exam done.  Probably [facility] would be the  cheapest place - POCT glucose (manual entry) - POCT glycosylated hemoglobin (Hb A1C) - metFORMIN (GLUCOPHAGE) 1000 MG tablet; Take 1 tablet (1,000 mg total) by mouth 2 (two) times daily with a meal.  Dispense: 180 tablet; Refill: 2 - glimepiride (AMARYL) 4 MG tablet; Take 1 tablet (4 mg total) by mouth 2 (two) times daily.  Dispense: 180 tablet; Refill: 2 - sitaGLIPtin (JANUVIA) 50 MG tablet; Take 1 tablet (50 mg total) by mouth daily.  Dispense: 90 tablet; Refill: 3  2. Essential hypertension Not at goal. Inc Lisinopril to 20 mg daily - lisinopril (PRINIVIL,ZESTRIL) 20 MG tablet; Take 1 tablet (20 mg total) by mouth daily.  Dispense: 90 tablet; Refill: 2 - atenolol (TENORMIN) 50 MG tablet; Take 1 tablet (50 mg total) by mouth daily.  Dispense: 90 tablet; Refill: 2  3. Hyperlipidemia, unspecified hyperlipidemia type - lovastatin (MEVACOR) 40 MG tablet; Take 1 tablet (40 mg total) by mouth at bedtime.  Dispense: 90 tablet; Refill: 2  4. Colon cancer screening CMA to give patient detailed instructions on how to do the fit test. - Fecal occult blood, imunochemical([facility])    Patient was given the opportunity to ask questions.  Patient verbalized understanding of the plan and was able to repeat key elements of the plan.   Orders Placed This Encounter  Procedures  . Fecal occult blood, imunochemical([facility])  . POCT glucose (manual entry)  . POCT glycosylated hemoglobin (Hb A1C)     Requested Prescriptions   Signed Prescriptions Disp Refills  . metFORMIN (GLUCOPHAGE) 1000 MG tablet 180 tablet 2    Sig: Take 1 tablet (1,000 mg total) by mouth 2 (two) times daily with a meal.  . lisinopril (PRINIVIL,ZESTRIL) 20 MG tablet 90 tablet 2    Sig: Take 1 tablet (20 mg total) by mouth daily.  Marland Kitchen. glimepiride (AMARYL) 4 MG tablet 180 tablet 2    Sig: Take 1 tablet (4 mg total) by mouth 2 (two) times daily.  Marland Kitchen. lovastatin (MEVACOR) 40 MG tablet 90 tablet 2    Sig: Take 1  tablet (40 mg total) by mouth at bedtime.  Marland Kitchen. atenolol (TENORMIN) 50 MG tablet 90 tablet 2    Sig: Take 1 tablet (50 mg total) by mouth daily.  . sitaGLIPtin (JANUVIA) 50 MG tablet 90 tablet 3    Sig: Take 1 tablet (50 mg total) by mouth daily.    Return in about 3 months (around 07/04/2017).  Jonah Blueeborah Major [place], MD, FACP

## 2017-04-04 NOTE — Patient Instructions (Signed)
Increase Lisinopril  to 40 mg daily.  You need to have an eye exam once a year.

## 2017-04-19 LAB — FECAL OCCULT BLOOD, IMMUNOCHEMICAL: Fecal Occult Bld: NEGATIVE

## 2017-04-22 ENCOUNTER — Telehealth: Payer: Self-pay | Admitting: Pharmacist

## 2017-04-22 MED ORDER — ATORVASTATIN CALCIUM 20 MG PO TABS: 20.0000 mg | ORAL_TABLET | Freq: Every day | ORAL | 3 refills | Status: DC

## 2017-04-22 NOTE — Addendum Note (Signed)
Addended by: Jonah BlueJOHNSON, DEBORAH B on: 04/22/2017 11:07 PM   Modules accepted: Orders

## 2017-04-22 NOTE — Telephone Encounter (Signed)
Patient does not have active Medicaid and therefore has no insurance. [facility] and simvastatin. Otherwise, would need to use [facility]. Will forward to PCP for review.

## 2017-04-24 ENCOUNTER — Telehealth: Payer: Self-pay

## 2017-04-24 NOTE — Telephone Encounter (Signed)
Contacted pt to go over colonguard results pt didn't answer and was unable to lvm  If pt calls back please give results: Cologuard test was negative which is good. Will repeat in 1 yr.

## 2017-08-27 ENCOUNTER — Ambulatory Visit: Payer: Self-pay | Attending: Family Medicine

## 2017-08-30 ENCOUNTER — Ambulatory Visit: Payer: Medicaid Other

## 2017-12-20 ENCOUNTER — Encounter: Payer: Self-pay | Admitting: Family Medicine

## 2018-01-04 ENCOUNTER — Encounter: Payer: Self-pay | Admitting: Family Medicine

## 2018-01-04 ENCOUNTER — Ambulatory Visit: Payer: Self-pay | Admitting: Family Medicine

## 2018-01-04 VITALS — BP 168/92 | HR 94 | Temp 98.4°F | Ht 69.0 in | Wt 184.4 lb

## 2018-01-04 DIAGNOSIS — E1165 Type 2 diabetes mellitus with hyperglycemia: Secondary | ICD-10-CM

## 2018-01-04 DIAGNOSIS — E119 Type 2 diabetes mellitus without complications: Secondary | ICD-10-CM

## 2018-01-04 DIAGNOSIS — I10 Essential (primary) hypertension: Secondary | ICD-10-CM

## 2018-01-04 MED ORDER — ATENOLOL 50 MG PO TABS: 50.0000 mg | ORAL_TABLET | Freq: Every day | ORAL | 2 refills | Status: DC

## 2018-01-04 MED ORDER — GLIMEPIRIDE 4 MG PO TABS: 4.0000 mg | ORAL_TABLET | Freq: Two times a day (BID) | ORAL | 2 refills | Status: DC

## 2018-01-04 MED ORDER — METFORMIN HCL 1000 MG PO TABS: 1000.0000 mg | ORAL_TABLET | Freq: Two times a day (BID) | ORAL | 2 refills | Status: DC

## 2018-01-04 MED ORDER — LISINOPRIL-HYDROCHLOROTHIAZIDE 20-12.5 MG PO TABS: ORAL_TABLET | ORAL | 3 refills | Status: DC

## 2018-01-04 MED ORDER — ATORVASTATIN CALCIUM 20 MG PO TABS: 20.0000 mg | ORAL_TABLET | Freq: Every day | ORAL | 3 refills | Status: DC

## 2018-01-04 NOTE — Progress Notes (Signed)
S, here to get refills on medicine  No complain O, Heart normal Feet normal Chest normal A, Uncontrolled type 2 diabetes mellitus with hyperglycemia (HCC)  Essential hypertension - Plan: atenolol (TENORMIN) 50 MG tablet  Controlled type 2 diabetes mellitus without complication, without long-term current use of insulin (HCC) - Plan: glimepiride (AMARYL) 4 MG tablet, metFORMIN (GLUCOPHAGE) 1000 MG tablet   Plan: Requested Prescriptions   Signed Prescriptions Disp Refills  . lisinopril-hydrochlorothiazide (ZESTORETIC) 20-12.5 MG tablet 180 tablet 3    Sig: 1 tab po bid  . atenolol (TENORMIN) 50 MG tablet 90 tablet 2    Sig: Take 1 tablet (50 mg total) by mouth daily.  Marland Kitchen atorvastatin (LIPITOR) 20 MG tablet 90 tablet 3    Sig: Take 1 tablet (20 mg total) by mouth daily.  Marland Kitchen glimepiride (AMARYL) 4 MG tablet 180 tablet 2    Sig: Take 1 tablet (4 mg total) by mouth 2 (two) times daily.  . metFORMIN (GLUCOPHAGE) 1000 MG tablet 180 tablet 2    Sig: Take 1 tablet (1,000 mg total) by mouth 2 (two) times daily with a meal.   Increase lisinopril to bid  Seem him 2 weeks

## 2018-01-27 ENCOUNTER — Other Ambulatory Visit: Payer: Self-pay | Admitting: Family Medicine

## 2018-01-28 LAB — BASIC METABOLIC PANEL
BUN/Creatinine Ratio: 16 (ref 9–20)
BUN: 13 mg/dL (ref 6–24)
CO2: 24 mmol/L (ref 20–29)
Calcium: 9.3 mg/dL (ref 8.7–10.2)
Chloride: 94 mmol/L — ABNORMAL LOW (ref 96–106)
Creatinine, Ser: 0.82 mg/dL (ref 0.76–1.27)
GFR calc Af Amer: 115 mL/min/{1.73_m2} (ref >59)
GFR calc non Af Amer: 100 mL/min/{1.73_m2} (ref >59)
Glucose: 211 mg/dL — ABNORMAL HIGH (ref 65–99)
Potassium: 4.8 mmol/L (ref 3.5–5.2)
Sodium: 136 mmol/L (ref 134–144)

## 2018-01-28 LAB — LIPID PANEL WITH LDL/HDL RATIO
Cholesterol, Total: 164 mg/dL (ref 100–199)
HDL: 33 mg/dL — ABNORMAL LOW (ref >39)
LDL Calculated: 89 mg/dL (ref 0–99)
LDl/HDL Ratio: 2.7 ratio (ref 0.0–3.6)
Triglycerides: 208 mg/dL — ABNORMAL HIGH (ref 0–149)
VLDL Cholesterol Cal: 42 mg/dL — ABNORMAL HIGH (ref 5–40)

## 2018-01-28 LAB — TSH: TSH: 3.78 u[IU]/mL (ref 0.450–4.500)

## 2018-02-01 ENCOUNTER — Encounter: Payer: Self-pay | Admitting: Adult Health

## 2018-02-01 ENCOUNTER — Ambulatory Visit: Payer: Self-pay | Admitting: Internal Medicine

## 2018-02-01 VITALS — BP 143/94 | HR 78 | Temp 97.3°F | Ht 68.5 in | Wt 182.8 lb

## 2018-02-01 DIAGNOSIS — E119 Type 2 diabetes mellitus without complications: Secondary | ICD-10-CM

## 2018-02-01 DIAGNOSIS — Z0100 Encounter for examination of eyes and vision without abnormal findings: Secondary | ICD-10-CM

## 2018-02-01 DIAGNOSIS — E1169 Type 2 diabetes mellitus with other specified complication: Secondary | ICD-10-CM

## 2018-02-01 DIAGNOSIS — I10 Essential (primary) hypertension: Secondary | ICD-10-CM

## 2018-02-01 MED ORDER — LOVASTATIN 20 MG PO TABS: 20.0000 mg | ORAL_TABLET | Freq: Every day | ORAL | 3 refills | Status: DC

## 2018-02-01 MED ORDER — SITAGLIPTIN PHOS-METFORMIN HCL 50-500 MG PO TABS: 1.0000 | ORAL_TABLET | Freq: Two times a day (BID) | ORAL | 2 refills | Status: DC

## 2018-02-01 NOTE — Progress Notes (Signed)
   Office Visit Note   Patient: Todd Barnes           Date of Birth: [birth year]           MRN: [id number] Visit Date: 02/01/2018              Requested by: Marcine MatarJohnson, Deborah B, MD [address] [place], [place] [id number] PCP: Marcine MatarJohnson, Deborah B, MD   Assessment & Plan:  Visit Diagnoses: Diabetes , hypertension follow-up  Last visit reviewed Follow-Up Instructions: Walk daily for exercise, ADA diet.   Orders:   Medications refilled.   Procedures: None    Subjective: Chief Complaint  Patient presents with  . Follow-up    HPI: Diabetes follow-up  Review of Systems Negative.   Objective: Vital Signs: BP (!) 143/94 (BP Location: Right Arm, Patient Position: Sitting, Cuff Size: Normal)   Pulse 78   Temp (!) 97.3 F (36.3 C) (Oral)   Ht 5' 8.5" (1.74 m)   Wt 182 lb 12.8 oz (82.9 kg)   SpO2 98%   BMI 27.39 kg/m   Physical Exam Unremarkable.   Specialty Comments:  No specialty comments available.  Imaging: No results found.   PMFS History: Patient Active Problem List   Diagnosis Date Noted  . Controlled type 2 diabetes mellitus without complication, without long-term current use of insulin (HCC) 11/01/2016  . Essential hypertension 11/01/2016  . Hyperlipidemia 11/01/2016   Past Medical History:  Diagnosis Date  . Diabetes mellitus without complication (HCC)   . Hypertension     Family History  Problem Relation Age of Onset  . Diabetes Mother     History reviewed. No pertinent surgical history. Social History   Occupational History  . Occupation: house keeping  Tobacco Use  . Smoking status: Never Smoker  . Smokeless tobacco: Never Used  Substance and Sexual Activity  . Alcohol use: No  . Drug use: No  . Sexual activity: Never

## 2018-02-28 NOTE — Addendum Note (Signed)
Addended by: Annamaria Helling on: 02/28/2018 08:02 PM   Modules accepted: Orders

## 2018-02-28 NOTE — Addendum Note (Signed)
Addended by: Annamaria Helling on: 02/28/2018 08:33 PM   Modules accepted: Orders

## 2018-05-03 ENCOUNTER — Ambulatory Visit: Payer: Self-pay | Admitting: Adult Health

## 2018-10-10 ENCOUNTER — Ambulatory Visit: Payer: Self-pay | Attending: Family Medicine

## 2018-10-10 ENCOUNTER — Other Ambulatory Visit: Payer: Self-pay

## 2018-10-25 ENCOUNTER — Other Ambulatory Visit: Payer: Self-pay

## 2018-11-01 ENCOUNTER — Other Ambulatory Visit: Payer: Self-pay

## 2018-11-01 ENCOUNTER — Telehealth: Payer: Self-pay | Admitting: Internal Medicine

## 2018-11-01 DIAGNOSIS — E119 Type 2 diabetes mellitus without complications: Secondary | ICD-10-CM

## 2018-11-01 DIAGNOSIS — I10 Essential (primary) hypertension: Secondary | ICD-10-CM

## 2018-11-01 DIAGNOSIS — M25511 Pain in right shoulder: Secondary | ICD-10-CM

## 2018-11-01 DIAGNOSIS — E785 Hyperlipidemia, unspecified: Secondary | ICD-10-CM

## 2018-11-01 MED ORDER — GLIMEPIRIDE 4 MG PO TABS: 4.0000 mg | ORAL_TABLET | Freq: Two times a day (BID) | ORAL | 2 refills | Status: DC

## 2018-11-01 MED ORDER — ATENOLOL 50 MG PO TABS: 50.0000 mg | ORAL_TABLET | Freq: Every day | ORAL | 3 refills | Status: DC

## 2018-11-01 NOTE — Progress Notes (Signed)
Acute Office Visit  Subjective:    Patient ID: Todd Barnes, male    DOB: 05-29-1960, 58 y.o.   MRN: [id number]  No chief complaint on file.   HPI Patient is in today for intermittent right shoulder pain and medicine refills. Has tried Ibuprofen for pain which helps. No trauma, heavy lifting since has been off work due to [employer].  Past Medical History:  Diagnosis Date  . Diabetes mellitus without complication ([place])   . Hypertension     No past surgical history on file.  Family History  Problem Relation Age of Onset  . Diabetes Mother     Social History   Socioeconomic History  . Marital status: Married    Spouse name: Not on file  . Number of children: Not on file  . Years of education: 37  . Highest education level: Not on file  Occupational History  . Occupation: house keeping  Social Needs  . Financial resource strain: Not on file  . Food insecurity    Worry: Not on file    Inability: Not on file  . Transportation needs    Medical: Not on file    Non-medical: Not on file  Tobacco Use  . Smoking status: Never Smoker  . Smokeless tobacco: Never Used  Substance and Sexual Activity  . Alcohol use: No  . Drug use: No  . Sexual activity: Never  Lifestyle  . Physical activity    Days per week: Not on file    Minutes per session: Not on file  . Stress: Not on file  Relationships  . Social Herbalist on phone: Not on file    Gets together: Not on file    Attends religious service: Not on file    Active member of club or organization: Not on file    Attends meetings of clubs or organizations: Not on file    Relationship status: Not on file  . Intimate partner violence    Fear of current or ex partner: Not on file    Emotionally abused: Not on file    Physically abused: Not on file    Forced sexual activity: Not on file  Other Topics Concern  . Not on file  Social History Narrative  . Not on file    Outpatient Medications Prior to  Visit  Medication Sig Dispense Refill  . aspirin EC 81 MG tablet Take 81 mg by mouth daily.    Marland Kitchen atenolol (TENORMIN) 50 MG tablet Take 1 tablet (50 mg total) by mouth daily. (Patient taking differently: Take 50 mg by mouth daily. ) 90 tablet 2  . atorvastatin (LIPITOR) 20 MG tablet Take 1 tablet (20 mg total) by mouth daily. 90 tablet 3  . glimepiride (AMARYL) 4 MG tablet Take 1 tablet (4 mg total) by mouth 2 (two) times daily. 180 tablet 2  . lisinopril-hydrochlorothiazide (ZESTORETIC) 20-12.5 MG tablet 1 tab po bid 180 tablet 3  . lovastatin (MEVACOR) 20 MG tablet Take 1 tablet (20 mg total) by mouth at bedtime. (Patient not taking: Reported on 02/01/2018) 90 tablet 3  . sitaGLIPtin-metformin (JANUMET) 50-500 MG tablet Take 1 tablet by mouth 2 (two) times daily with a meal. 160 tablet 2   No facility-administered medications prior to visit.     No Known Allergies  ROS As above, all others nagative    Objective:    Physical Exam  There were no vitals taken for this visit. Wt Readings from Last  3 Encounters:  02/01/18 182 lb 12.8 oz (82.9 kg)  01/04/18 184 lb 6.4 oz (83.6 kg)  04/03/16 180 lb 6.4 oz (81.8 kg)    Health Maintenance Due  Topic Date Due  . HIV Screening  06/14/1977  . OPHTHALMOLOGY EXAM  02/02/2017  . FOOT EXAM  07/27/2017  . HEMOGLOBIN A1C  10/04/2017  . INFLUENZA VACCINE  08/02/2018    There are no preventive care reminders to display for this patient.   Lab Results  Component Value Date   TSH 3.780 01/27/2018   Lab Results  Component Value Date   WBC 5.4 07/27/2016   HGB 8.4 (A) 11/01/2016   HCT 41.1 07/27/2016   MCV 82 07/27/2016   PLT 270 07/27/2016   Lab Results  Component Value Date   NA 136 01/27/2018   K 4.8 01/27/2018   CO2 24 01/27/2018   GLUCOSE 211 (H) 01/27/2018   BUN 13 01/27/2018   CREATININE 0.82 01/27/2018   BILITOT 0.2 07/27/2016   ALKPHOS 77 07/27/2016   AST 15 07/27/2016   ALT 12 07/27/2016   PROT 7.5 07/27/2016    ALBUMIN 4.7 07/27/2016   CALCIUM 9.3 01/27/2018   ANIONGAP 11 06/27/2016   Lab Results  Component Value Date   CHOL 164 01/27/2018   Lab Results  Component Value Date   HDL 33 (L) 01/27/2018   Lab Results  Component Value Date   LDLCALC 89 01/27/2018   Lab Results  Component Value Date   TRIG 208 (H) 01/27/2018   Lab Results  Component Value Date   CHOLHDL 3.2 07/27/2016   Lab Results  Component Value Date   HGBA1C 7.4 04/04/2017       Assessment & Plan:   Problem List Items Addressed This Visit    None     - Ibuprofen 400 mg BID for 5 days -Amaryl and Tenormin refills - F/U in 1 week  No orders of the defined types were placed in this encounter.    Kirt Boys, MD

## 2018-11-07 ENCOUNTER — Encounter: Payer: Self-pay | Admitting: Internal Medicine

## 2018-11-07 ENCOUNTER — Ambulatory Visit: Payer: Self-pay | Attending: Internal Medicine | Admitting: Internal Medicine

## 2018-11-07 ENCOUNTER — Other Ambulatory Visit: Payer: Self-pay

## 2018-11-07 VITALS — BP 137/81 | HR 88 | Temp 99.6°F | Resp 16 | Ht 68.0 in | Wt 185.2 lb

## 2018-11-07 DIAGNOSIS — E785 Hyperlipidemia, unspecified: Secondary | ICD-10-CM

## 2018-11-07 DIAGNOSIS — I10 Essential (primary) hypertension: Secondary | ICD-10-CM

## 2018-11-07 DIAGNOSIS — M25511 Pain in right shoulder: Secondary | ICD-10-CM

## 2018-11-07 DIAGNOSIS — E119 Type 2 diabetes mellitus without complications: Secondary | ICD-10-CM

## 2018-11-07 DIAGNOSIS — G8929 Other chronic pain: Secondary | ICD-10-CM

## 2018-11-07 LAB — GLUCOSE, POCT (MANUAL RESULT ENTRY): POC Glucose: 386 mg/dl — AB (ref 70–99)

## 2018-11-07 LAB — POCT GLYCOSYLATED HEMOGLOBIN (HGB A1C): HbA1c, POC (controlled diabetic range): 10.6 % — AB (ref 0.0–7.0)

## 2018-11-07 MED ORDER — LOVASTATIN 20 MG PO TABS: 20.0000 mg | ORAL_TABLET | Freq: Every day | ORAL | 3 refills | Status: DC

## 2018-11-07 MED ORDER — METFORMIN HCL 1000 MG PO TABS: ORAL_TABLET | ORAL | 3 refills | Status: DC

## 2018-11-07 NOTE — Progress Notes (Signed)
Patient ID: Kaulder Zahner, male    DOB: [birth year]  MRN: [id number]  CC: Diabetes and Hypertension   Subjective: Todd Barnes is a 58 y.o. male who presents for chronic ds management.  I last saw him in the spring of last year.  He was seeing another [facility] practice but returned to [place] due to lack of insurance His concerns today include:  Hx DM, HTN and HL  DIABETES TYPE 2 Last A1C:   Results for orders placed or performed in visit on 11/07/18  Glucose (CBG)  Result Value Ref Range   POC Glucose 386 (A) 70 - 99 mg/dl  HgB C1E  Result Value Ref Range   Hemoglobin A1C     HbA1c POC (<> result, manual entry)     HbA1c, POC (prediabetic range)     HbA1c, POC (controlled diabetic range) 10.6 (A) 0.0 - 7.0 %    Med Adherence:  [x]  Yes -on Janumet and Amaryl   []  No Medication side effects:  []  Yes    []  No Home Monitoring?  [x]  Yes  - in the mornings Home glucose results range: Before BF in 150s Diet Adherence: eats small portions and no sugar stuff Exercise: [x]  Yes  -walking 2 x a wk for 30 mins Hypoglycemic episodes?: []  Yes    [x]  No Numbness of the feet? []  Yes    [x]  No Retinopathy hx? []  Yes    [x]  No Last eye exam: no blurred vision. Over due for eye exam Comments:   HYPERTENSION Currently taking: see medication list -he is on atenolol and lisinopril/HCTZ. Med Adherence: [x]  Yes    []  No Medication side effects: []  Yes    [x]  No Adherence with salt restriction: []  Yes    [x]  No Home Monitoring?: [x]  Yes  In mornings Monitoring Frequency:  Home BP results range: reports SBP in the 120-140.  Does not recall DBP SOB? []  Yes    [x]  No Chest Pain?: []  Yes    [x]  No Leg swelling?: []  Yes    [x]  No Headaches?: []  Yes    [x]  No Dizziness? []  Yes    [x]  No Comments:    HL: He is on lovastatin.  He would like this prescription to be sent to our pharmacy as it would be cheaper for him  C/o pain in RT shoulder x 3 mths.  No initiating factors.  States that it  throbs mainly over the posterior joint.  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  Patient Active Problem List   Diagnosis Date Noted  . Controlled type 2 diabetes mellitus without complication, without long-term current use of insulin (HCC) 11/01/2016  . Essential hypertension 11/01/2016  . Hyperlipidemia 11/01/2016     Current Outpatient Medications on File Prior to Visit  Medication Sig Dispense Refill  . aspirin EC 81 MG tablet Take 81 mg by mouth daily.    atenolol (TENORMIN) 50 MG tablet Take 1 tablet (50 mg total) by mouth daily. (Patient taking differently: Take 50 mg by mouth daily. ) 90 tablet 2  . atenolol (TENORMIN) 50 MG tablet Take 1 tablet (50 mg total) by mouth daily. 90 tablet 3  . atorvastatin (LIPITOR) 20 MG tablet Take 1 tablet (20 mg total) by mouth daily. 90 tablet 3  . glimepiride (AMARYL) 4 MG tablet Take  1 tablet (4 mg total) by mouth 2 (two) times daily. 180 tablet 2  . lisinopril-hydrochlorothiazide (ZESTORETIC) 20-12.5 MG tablet 1 tab po bid 180 tablet 3  . sitaGLIPtin-metformin (JANUMET) 50-500 MG tablet Take 1 tablet by mouth 2 (two) times daily with a meal. 160 tablet 2   No current facility-administered medications on file prior to visit.     No Known Allergies  Social History   Socioeconomic History  . Marital status: Married    Spouse name: Not on file  . Number of children: Not on file  . Years of education: 59  . Highest education level: Not on file  Occupational History  . Occupation: house keeping  Social Needs  . Financial resource strain: Not on file  . Food insecurity    Worry: Not on file    Inability: Not on file  . Transportation needs    Medical: Not on file    Non-medical: Not on file  Tobacco Use  . Smoking status: Never Smoker  . Smokeless tobacco: Never Used  Substance and Sexual Activity  . Alcohol use: No   . Drug use: No  . Sexual activity: Never  Lifestyle  . Physical activity    Days per week: Not on file    Minutes per session: Not on file  . Stress: Not on file  Relationships  . Social Herbalist on phone: Not on file    Gets together: Not on file    Attends religious service: Not on file    Active member of club or organization: Not on file    Attends meetings of clubs or organizations: Not on file    Relationship status: Not on file  . Intimate partner violence    Fear of current or ex partner: Not on file    Emotionally abused: Not on file    Physically abused: Not on file    Forced sexual activity: Not on file  Other Topics Concern  . Not on file  Social History Narrative  . Not on file    Family History  Problem Relation Age of Onset  . Diabetes Mother     No past surgical history on file.  ROS: Review of Systems Negative except as stated above  PHYSICAL EXAM: BP 137/81   Pulse 88   Temp 99.6 F (37.6 C) (Oral)   Resp 16   Ht 5\' 8"  (1.727 m)   Wt 185 lb 3.2 oz (84 kg)   SpO2 96%   BMI 28.16 kg/m   Physical Exam General appearance - alert, well appearing, and in no distress Mental status - normal mood, behavior, speech, dress, motor activity, and thought processes Neck - supple, no significant adenopathy Chest - clear to auscultation, no wheezes, rales or rhonchi, symmetric air entry Heart - normal rate, regular rhythm, normal S1, S2, no murmurs, rubs, clicks or gallops Musculoskeletal -right shoulder: No edema.  No point tenderness.  Mild discomfort with passive range of motion in all directions.  Drop arm test negative. Extremities - peripheral pulses normal, no pedal edema, no clubbing or cyanosis  CMP Latest Ref Rng & Units 01/27/2018 07/27/2016 06/27/2016  Glucose 65 - 99 mg/dL 211(H) 197(H) 222(H)  BUN 6 - 24 mg/dL 13 16 11   Creatinine 0.76 - 1.27 mg/dL 0.82 0.84 0.77  Sodium 134 - 144 mmol/L 136 139 132(L)  Potassium 3.5 - 5.2 mmol/L  4.8 4.8 4.0  Chloride 96 - 106 mmol/L 94(L) 101 98(L)  CO2 20 - 29 mmol/L 24 23 23   Calcium 8.7 - 10.2 mg/dL 9.3 9.7 8.1(X8.5(L)  Total Protein 6.0 - 8.5 g/dL - 7.5 6.9  Total Bilirubin 0.0 - 1.2 mg/dL - 0.2 0.7  Alkaline Phos 39 - 117 IU/L - 77 60  AST 0 - 40 IU/L - 15 26  ALT 0 - 44 IU/L - 12 25   Lipid Panel     Component Value Date/Time   CHOL 164 01/27/2018 1112   TRIG 208 (H) 01/27/2018 1112   HDL 33 (L) 01/27/2018 1112   CHOLHDL 3.2 07/27/2016 1222   LDLCALC 89 01/27/2018 1112    CBC    Component Value Date/Time   WBC 5.4 07/27/2016 1222   WBC 8.1 06/27/2016 0038   RBC 5.02 07/27/2016 1222   RBC 4.89 06/27/2016 0038   HGB 8.4 (A) 11/01/2016   HGB 13.8 07/27/2016 1222   HCT 41.1 07/27/2016 1222   PLT 270 07/27/2016 1222   MCV 82 07/27/2016 1222   MCH 27.5 07/27/2016 1222   MCH 26.6 06/27/2016 0038   MCHC 33.6 07/27/2016 1222   MCHC 32.7 06/27/2016 0038   RDW 13.9 07/27/2016 1222    ASSESSMENT AND PLAN:  1. Type 2 diabetes mellitus without complication, without long-term current use of insulin (HCC) Not at goal.  He will continue Amaryl and Janumet.  I have added Metformin 500 mg twice a day.  Dietary counseling given.  Encouraged him to increase physical activity to 3 to 4 days a week for 30 minutes. - Glucose (CBG) - HgB A1c - Microalbumin/Creatinine Ratio, Urine - metFORMIN (GLUCOPHAGE) 1000 MG tablet; 1/2 tab PO twice a day.  Dispense: 90 tablet; Refill: 3 - Lipid panel - Comprehensive metabolic panel - CBC  2. Essential hypertension Not at goal.  Advised patient to record blood pressure readings and bring them in in about a month to see our clinical pharmacist for recheck.  3. Hyperlipidemia, unspecified hyperlipidemia type - lovastatin (MEVACOR) 20 MG tablet; Take 1 tablet (20 mg total) by mouth at bedtime.  Dispense: 90 tablet; Refill: 3  4. Chronic right shoulder pain I recommend Tylenol as needed.  Will get x-ray of the shoulder.  Advised to apply  for the orange card/cone discount card so that we can refer to sports medicine if needed - DG Shoulder Right; Future    Patient was given the opportunity to ask questions.  Patient verbalized understanding of the plan and was able to repeat key elements of the plan.   Orders Placed This Encounter  Procedures  . DG Shoulder Right  . Microalbumin/Creatinine Ratio, Urine  . Lipid panel  . Comprehensive metabolic panel  . CBC  . Glucose (CBG)  . HgB A1c     Requested Prescriptions   Signed Prescriptions Disp Refills  . metFORMIN (GLUCOPHAGE) 1000 MG tablet 90 tablet 3    Sig: 1/2 tab PO twice a day.  . lovastatin (MEVACOR) 20 MG tablet 90 tablet 3    Sig: Take 1 tablet (20 mg total) by mouth at bedtime.    Return in about 4 months (around 03/07/2019).  Jonah Blueeborah Saivion Goettel, MD, FACP

## 2018-11-07 NOTE — Patient Instructions (Signed)
Give patient an appointment with Lurena Joiner in 3 weeks for repeat blood pressure check.

## 2018-11-08 ENCOUNTER — Other Ambulatory Visit: Payer: Self-pay | Admitting: Internal Medicine

## 2018-11-08 ENCOUNTER — Telehealth: Payer: Self-pay | Admitting: Internal Medicine

## 2018-11-08 DIAGNOSIS — M25511 Pain in right shoulder: Secondary | ICD-10-CM

## 2018-11-08 DIAGNOSIS — E1165 Type 2 diabetes mellitus with hyperglycemia: Secondary | ICD-10-CM

## 2018-11-08 DIAGNOSIS — I10 Essential (primary) hypertension: Secondary | ICD-10-CM

## 2018-11-08 DIAGNOSIS — E781 Pure hyperglyceridemia: Secondary | ICD-10-CM

## 2018-11-08 LAB — CBC
Hematocrit: 41.1 % (ref 37.5–51.0)
Hemoglobin: 13.3 g/dL (ref 13.0–17.7)
MCH: 26.3 pg — ABNORMAL LOW (ref 26.6–33.0)
MCHC: 32.4 g/dL (ref 31.5–35.7)
MCV: 81 fL (ref 79–97)
Platelets: 310 10*3/uL (ref 150–450)
RBC: 5.06 x10E6/uL (ref 4.14–5.80)
RDW: 12.3 % (ref 11.6–15.4)
WBC: 6.6 10*3/uL (ref 3.4–10.8)

## 2018-11-08 LAB — COMPREHENSIVE METABOLIC PANEL
ALT: 20 IU/L (ref 0–44)
AST: 12 IU/L (ref 0–40)
Albumin/Globulin Ratio: 1.4 (ref 1.2–2.2)
Albumin: 4.3 g/dL (ref 3.8–4.9)
Alkaline Phosphatase: 82 IU/L (ref 39–117)
BUN/Creatinine Ratio: 17 (ref 9–20)
BUN: 16 mg/dL (ref 6–24)
Bilirubin Total: <0.2 mg/dL (ref 0.0–1.2)
CO2: 24 mmol/L (ref 20–29)
Calcium: 9.5 mg/dL (ref 8.7–10.2)
Chloride: 102 mmol/L (ref 96–106)
Creatinine, Ser: 0.94 mg/dL (ref 0.76–1.27)
GFR calc Af Amer: 104 mL/min/{1.73_m2} (ref >59)
GFR calc non Af Amer: 90 mL/min/{1.73_m2} (ref >59)
Globulin, Total: 3.1 g/dL (ref 1.5–4.5)
Glucose: 258 mg/dL — ABNORMAL HIGH (ref 65–99)
Potassium: 4.2 mmol/L (ref 3.5–5.2)
Sodium: 139 mmol/L (ref 134–144)
Total Protein: 7.4 g/dL (ref 6.0–8.5)

## 2018-11-08 LAB — MICROALBUMIN / CREATININE URINE RATIO
Creatinine, Urine: 60.1 mg/dL
Microalb/Creat Ratio: <5 mg/g creat (ref 0–29)
Microalbumin, Urine: <3 ug/mL

## 2018-11-08 LAB — LIPID PANEL
Chol/HDL Ratio: 5.1 ratio — ABNORMAL HIGH (ref 0.0–5.0)
Cholesterol, Total: 173 mg/dL (ref 100–199)
HDL: 34 mg/dL — ABNORMAL LOW (ref >39)
LDL Chol Calc (NIH): 56 mg/dL (ref 0–99)
Triglycerides: 561 mg/dL (ref 0–149)
VLDL Cholesterol Cal: 83 mg/dL — ABNORMAL HIGH (ref 5–40)

## 2018-11-08 MED ORDER — FENOFIBRATE 145 MG PO TABS: 145.0000 mg | ORAL_TABLET | Freq: Every day | ORAL | 3 refills | Status: DC

## 2018-11-08 NOTE — Progress Notes (Signed)
Established Patient Office Visit  Subjective:  Patient ID: Todd Barnes, male    DOB: 01/12/1960  Age: 58 y.o. MRN: [id number]  CC: No chief complaint on file.   HPI Todd Barnes presents for  F/U labs, HTN, DM, hyperlipidemia and R shoulder pain.   Past Medical History:  Diagnosis Date  . Diabetes mellitus without complication (HCC)   . Hypertension     No past surgical history on file.  Family History  Problem Relation Age of Onset  . Diabetes Mother     Social History   Socioeconomic History  . Marital status: Married    Spouse name: Not on file  . Number of children: Not on file  . Years of education: 49  . Highest education level: Not on file  Occupational History  . Occupation: house keeping  Social Needs  . Financial resource strain: Not on file  . Food insecurity    Worry: Not on file    Inability: Not on file  . Transportation needs    Medical: Not on file    Non-medical: Not on file  Tobacco Use  . Smoking status: Never Smoker  . Smokeless tobacco: Never Used  Substance and Sexual Activity  . Alcohol use: No  . Drug use: No  . Sexual activity: Never  Lifestyle  . Physical activity    Days per week: Not on file    Minutes per session: Not on file  . Stress: Not on file  Relationships  . Social Musician on phone: Not on file    Gets together: Not on file    Attends religious service: Not on file    Active member of club or organization: Not on file    Attends meetings of clubs or organizations: Not on file    Relationship status: Not on file  . Intimate partner violence    Fear of current or ex partner: Not on file    Emotionally abused: Not on file    Physically abused: Not on file    Forced sexual activity: Not on file  Other Topics Concern  . Not on file  Social History Narrative  . Not on file    Outpatient Medications Prior to Visit  Medication Sig Dispense Refill  . aspirin EC 81 MG tablet Take 81 mg by  mouth daily.    Marland Kitchen atenolol (TENORMIN) 50 MG tablet Take 1 tablet (50 mg total) by mouth daily. (Patient taking differently: Take 50 mg by mouth daily. ) 90 tablet 2  . atenolol (TENORMIN) 50 MG tablet Take 1 tablet (50 mg total) by mouth daily. 90 tablet 3  . atorvastatin (LIPITOR) 20 MG tablet Take 1 tablet (20 mg total) by mouth daily. 90 tablet 3  . glimepiride (AMARYL) 4 MG tablet Take 1 tablet (4 mg total) by mouth 2 (two) times daily. 180 tablet 2  . lisinopril-hydrochlorothiazide (ZESTORETIC) 20-12.5 MG tablet 1 tab po bid 180 tablet 3  . lovastatin (MEVACOR) 20 MG tablet Take 1 tablet (20 mg total) by mouth at bedtime. 90 tablet 3  . metFORMIN (GLUCOPHAGE) 1000 MG tablet 1/2 tab PO twice a day. 90 tablet 3  . sitaGLIPtin-metformin (JANUMET) 50-500 MG tablet Take 1 tablet by mouth 2 (two) times daily with a meal. 160 tablet 2   No facility-administered medications prior to visit.     No Known Allergies  ROS Review of Systems     Objective:    Physical Exam  There were no vitals taken for this visit. Wt Readings from Last 3 Encounters:  11/07/18 185 lb 3.2 oz (84 kg)  02/01/18 182 lb 12.8 oz (82.9 kg)  01/04/18 184 lb 6.4 oz (83.6 kg)     Health Maintenance Due  Topic Date Due  . HIV Screening  06/14/1977  . OPHTHALMOLOGY EXAM  02/02/2017  . FOOT EXAM  07/27/2017    There are no preventive care reminders to display for this patient.  Lab Results  Component Value Date   TSH 3.780 01/27/2018   Lab Results  Component Value Date   WBC 6.6 11/07/2018   HGB 13.3 11/07/2018   HCT 41.1 11/07/2018   MCV 81 11/07/2018   PLT 310 11/07/2018   Lab Results  Component Value Date   NA 139 11/07/2018   K 4.2 11/07/2018   CO2 24 11/07/2018   GLUCOSE 258 (H) 11/07/2018   BUN 16 11/07/2018   CREATININE 0.94 11/07/2018   BILITOT <0.2 11/07/2018   ALKPHOS 82 11/07/2018   AST 12 11/07/2018   ALT 20 11/07/2018   PROT 7.4 11/07/2018   ALBUMIN 4.3 11/07/2018   CALCIUM  9.5 11/07/2018   ANIONGAP 11 06/27/2016   Lab Results  Component Value Date   CHOL 173 11/07/2018   Lab Results  Component Value Date   HDL 34 (L) 11/07/2018   Lab Results  Component Value Date   LDLCALC 56 11/07/2018   Lab Results  Component Value Date   TRIG 561 (HH) 11/07/2018   Lab Results  Component Value Date   CHOLHDL 5.1 (H) 11/07/2018   Lab Results  Component Value Date   HGBA1C 10.6 (A) 11/07/2018      Assessment & Plan:   Problem List Items Addressed This Visit      Cardiovascular and Mediastinum   Essential hypertension (Chronic)    Other Visit Diagnoses    Hypertriglyceridemia    -  Primary   Type 2 diabetes mellitus with hyperglycemia, without long-term current use of insulin (HCC)       Right shoulder pain, unspecified chronicity         DM; poorly controlled, A1c 10.6. [facility] and Metformin added to his regimen. Diet control and exercise encouraged.   HTN; Patient reports being controlled on current medication  Hypertriglyceridemia; Start Tricor and recheck in 3 months  R shoulder pain; Still having intermittent pain, Will try to set up for X-rays.   No orders of the defined types were placed in this encounter.   Follow-up: Return in about 3 months (around 02/08/2019).    Wallene Huh, MD

## 2018-11-10 ENCOUNTER — Telehealth: Payer: Self-pay

## 2018-11-10 NOTE — Telephone Encounter (Signed)
Contacted pt to go over lab results pt gave verbal consent to speak with his daughter Ira Dougher. Provided daughter pt results and she doesn't have any questions or concerns

## 2018-11-21 ENCOUNTER — Other Ambulatory Visit: Payer: Self-pay

## 2018-11-21 ENCOUNTER — Ambulatory Visit: Payer: Self-pay | Attending: Internal Medicine

## 2018-12-05 ENCOUNTER — Other Ambulatory Visit: Payer: Self-pay

## 2018-12-05 ENCOUNTER — Emergency Department (HOSPITAL_COMMUNITY)
Admission: EM | Admit: 2018-12-05 | Discharge: 2018-12-05 | Disposition: A | Discharge status: Home / Self Care | Payer: HRSA Program | Attending: Emergency Medicine | Admitting: Emergency Medicine

## 2018-12-05 ENCOUNTER — Emergency Department (HOSPITAL_COMMUNITY): Payer: HRSA Program

## 2018-12-05 ENCOUNTER — Encounter (HOSPITAL_COMMUNITY): Payer: Self-pay

## 2018-12-05 DIAGNOSIS — Z03818 Encounter for observation for suspected exposure to other biological agents ruled out: Secondary | ICD-10-CM

## 2018-12-05 DIAGNOSIS — E119 Type 2 diabetes mellitus without complications: Secondary | ICD-10-CM | POA: Insufficient documentation

## 2018-12-05 DIAGNOSIS — Z20822 Contact with and (suspected) exposure to covid-19: Secondary | ICD-10-CM

## 2018-12-05 DIAGNOSIS — Z79899 Other long term (current) drug therapy: Secondary | ICD-10-CM | POA: Insufficient documentation

## 2018-12-05 DIAGNOSIS — M25511 Pain in right shoulder: Secondary | ICD-10-CM | POA: Insufficient documentation

## 2018-12-05 DIAGNOSIS — I10 Essential (primary) hypertension: Secondary | ICD-10-CM | POA: Insufficient documentation

## 2018-12-05 DIAGNOSIS — G8929 Other chronic pain: Secondary | ICD-10-CM | POA: Diagnosis not present

## 2018-12-05 DIAGNOSIS — U071 COVID-19: Secondary | ICD-10-CM | POA: Diagnosis not present

## 2018-12-05 DIAGNOSIS — M791 Myalgia, unspecified site: Secondary | ICD-10-CM | POA: Diagnosis present

## 2018-12-05 LAB — COMPREHENSIVE METABOLIC PANEL
ALT: 20 U/L (ref 0–44)
AST: 23 U/L (ref 15–41)
Albumin: 3.7 g/dL (ref 3.5–5.0)
Alkaline Phosphatase: 56 U/L (ref 38–126)
Anion gap: 12 (ref 5–15)
BUN: 19 mg/dL (ref 6–20)
CO2: 23 mmol/L (ref 22–32)
Calcium: 8.3 mg/dL — ABNORMAL LOW (ref 8.9–10.3)
Chloride: 96 mmol/L — ABNORMAL LOW (ref 98–111)
Creatinine, Ser: 1.03 mg/dL (ref 0.61–1.24)
GFR calc Af Amer: >60 mL/min (ref >60)
GFR calc non Af Amer: >60 mL/min (ref >60)
Glucose, Bld: 195 mg/dL — ABNORMAL HIGH (ref 70–99)
Potassium: 3.9 mmol/L (ref 3.5–5.1)
Sodium: 131 mmol/L — ABNORMAL LOW (ref 135–145)
Total Bilirubin: 0.5 mg/dL (ref 0.3–1.2)
Total Protein: 6.9 g/dL (ref 6.5–8.1)

## 2018-12-05 LAB — CBC
HCT: 37.4 % — ABNORMAL LOW (ref 39.0–52.0)
Hemoglobin: 12 g/dL — ABNORMAL LOW (ref 13.0–17.0)
MCH: 26.3 pg (ref 26.0–34.0)
MCHC: 32.1 g/dL (ref 30.0–36.0)
MCV: 82 fL (ref 80.0–100.0)
Platelets: 213 10*3/uL (ref 150–400)
RBC: 4.56 MIL/uL (ref 4.22–5.81)
RDW: 12.2 % (ref 11.5–15.5)
WBC: 3.6 10*3/uL — ABNORMAL LOW (ref 4.0–10.5)
nRBC: 0 % (ref 0.0–0.2)

## 2018-12-05 LAB — LIPASE, BLOOD: Lipase: 51 U/L (ref 11–51)

## 2018-12-05 MED ORDER — DICLOFENAC SODIUM 1 % EX GEL: 4.0000 g | Freq: Four times a day (QID) | CUTANEOUS | 1 refills | Status: DC

## 2018-12-05 MED ORDER — SODIUM CHLORIDE 0.9% FLUSH: 3.0000 mL | Freq: Once | INTRAVENOUS | Status: DC

## 2018-12-05 NOTE — Discharge Instructions (Signed)
The Covid test should return by tomorrow and you can find that on your MyChart account.  Continue to rest take Tylenol for fever and aches.  You can also take Tylenol for your shoulder pain and use the cream I prescribed.

## 2018-12-05 NOTE — ED Provider Notes (Signed)
[facility] DEPT Provider Note   CSN: [id number] Arrival date & time: 12/05/18  1618     History   Chief Complaint Chief Complaint  Patient presents with   Fatigue   Nausea    HPI [facility] Todd Barnes is a 58 y.o. male.     Patient is a 59 year old male with a history of diabetes and hypertension who is presenting today with 3 days of generalized body aches, intermittent fever and chills, cough, nausea after eating only today and just not feeling well.  He denies any shortness of breath, abdominal pain, diarrhea or urinary symptoms.  He has not noticed any swelling in his legs or rashes.  Patient does note that approximately 4 days prior to symptoms starting he had traveled to [place] and was watching a soccer game for 3 days but stated as far as he could tell everybody was masked while at the game.  He denies any sick contacts at home.  He has had no recent change in his medications. Secondly patient states that his right shoulder has been hurting for the last 3 months.  He has seen his doctor about it but they have not done x-rays yet because his orange card was expired.  He cannot remember any injury but it is very painful if he sleeps on it wrong and when he tries to lift it over his head and behind his arm.  He does have a very physical job and is right-handed and states he may have injured it then.  Denies any radiation of pain down his arm or numbness to his hand.  The history is provided by the patient. The history is limited by a language barrier. A language interpreter was used.    Past Medical History:  Diagnosis Date   Diabetes mellitus without complication ([place])    Hypertension     Patient Active Problem List   Diagnosis Date Noted   Controlled type 2 diabetes mellitus without complication, without long-term current use of insulin ([place]) 11/01/2016   Essential hypertension 11/01/2016   Hyperlipidemia 11/01/2016    No past  surgical history on file.      Home Medications    Prior to Admission medications   Medication Sig Start Date End Date Taking? Authorizing Provider  atenolol (TENORMIN) 50 MG tablet Take 1 tablet (50 mg total) by mouth daily. 11/01/18  Yes Bhatti, Carma Lair, MD  atorvastatin (LIPITOR) 20 MG tablet Take 1 tablet (20 mg total) by mouth daily. 01/04/18  Yes Harlow Asa, MD  fenofibrate (TRICOR) 145 MG tablet Take 1 tablet (145 mg total) by mouth daily. 11/08/18  Yes Bhatti, Carma Lair, MD  glimepiride (AMARYL) 4 MG tablet Take 1 tablet (4 mg total) by mouth 2 (two) times daily. 11/01/18  Yes Bhatti, Carma Lair, MD  lisinopril-hydrochlorothiazide (ZESTORETIC) 20-12.5 MG tablet 1 tab po bid Patient taking differently: Take 1 tablet by mouth 2 (two) times daily.  01/04/18  Yes Harlow Asa, MD  lovastatin (MEVACOR) 20 MG tablet Take 1 tablet (20 mg total) by mouth at bedtime. 11/07/18  Yes Ladell Pier, MD  metFORMIN (GLUCOPHAGE) 1000 MG tablet 1/2 tab PO twice a day. Patient taking differently: Take 500 mg by mouth 2 (two) times daily with a meal.  11/07/18  Yes Ladell Pier, MD  sitaGLIPtin-metformin (JANUMET) 50-500 MG tablet Take 1 tablet by mouth 2 (two) times daily with a meal. 02/01/18  Yes Cletis Athens, MD  atenolol (TENORMIN) 50 MG tablet  Take 1 tablet (50 mg total) by mouth daily. Patient not taking: Reported on 12/05/2018 01/04/18   Lynne LeaderElmahdy, Wagdy, MD    Family History Family History  Problem Relation Age of Onset   Diabetes Mother     Social History Social History   Tobacco Use   Smoking status: Never Smoker   Smokeless tobacco: Never Used  Substance Use Topics   Alcohol use: No   Drug use: No     Allergies   Patient has no known allergies.   Review of Systems Review of Systems  All other systems reviewed and are negative.    Physical Exam Updated Vital Signs BP 133/81    Pulse 93    Temp 99.4 F (37.4 C) (Oral)    Resp 19    Ht 5\' 8"  (1.727 m)    Wt  81.6 kg    SpO2 100%    BMI 27.37 kg/m   Physical Exam Vitals signs and nursing note reviewed.  Constitutional:      General: He is not in acute distress.    Appearance: He is well-developed.  HENT:     Head: Normocephalic and atraumatic.  Eyes:     Conjunctiva/sclera: Conjunctivae normal.     Pupils: Pupils are equal, round, and reactive to light.  Neck:     Musculoskeletal: Normal range of motion and neck supple.  Cardiovascular:     Rate and Rhythm: Normal rate and regular rhythm.     Heart sounds: No murmur.  Pulmonary:     Effort: Pulmonary effort is normal. No respiratory distress.     Breath sounds: Normal breath sounds. No wheezing or rales.  Abdominal:     General: There is no distension.     Palpations: Abdomen is soft.     Tenderness: There is no abdominal tenderness. There is no guarding or rebound.  Musculoskeletal: Normal range of motion.        General: Tenderness present.       Arms:     Comments: Minimal pain over the right AC joint but more pronounced in the posterior shoulder.  Pain with internal and external rotation of the right shoulder and limited extension to approximately 90 degrees and then can only have passive extension beyond that.  No joint swelling warmth or erythema.  Hand grip 5 out of 5.  Radial pulse 2+  Skin:    General: Skin is warm and dry.     Findings: No erythema or rash.  Neurological:     General: No focal deficit present.     Mental Status: He is alert and oriented to person, place, and time. Mental status is at baseline.  Psychiatric:        Mood and Affect: Mood normal.        Behavior: Behavior normal.        Thought Content: Thought content normal.      ED Treatments / Results  Labs (all labs ordered are listed, but only abnormal results are displayed) Labs Reviewed  COMPREHENSIVE METABOLIC PANEL - Abnormal; Notable for the following components:      Result Value   Sodium 131 (*)    Chloride 96 (*)    Glucose, Bld 195  (*)    Calcium 8.3 (*)    All other components within normal limits  CBC - Abnormal; Notable for the following components:   WBC 3.6 (*)    Hemoglobin 12.0 (*)    HCT 37.4 (*)  All other components within normal limits  SARS CORONAVIRUS 2 (TAT 6-24 HRS)  LIPASE, BLOOD  URINALYSIS, ROUTINE W REFLEX MICROSCOPIC    EKG EKG Interpretation  Date/Time:  Friday December 05 2018 20:32:20 EST Ventricular Rate:  94 PR Interval:    QRS Duration: 72 QT Interval:  325 QTC Calculation: 407 R Axis:   41 Text Interpretation: Sinus rhythm Borderline T wave abnormalities No previous tracing Confirmed by Gwyneth Sprout ([id number]) on 12/05/2018 8:50:00 PM   Radiology Dg Chest Port 1 View  Result Date: 12/05/2018 CLINICAL DATA:  Cough and fever. EXAM: PORTABLE CHEST 1 VIEW COMPARISON:  None. FINDINGS: The heart size and mediastinal contours are within normal limits. Both lungs are clear. The visualized skeletal structures are unremarkable. IMPRESSION: No active disease. Electronically Signed   By: Gerome Sam III M.D   On: 12/05/2018 20:37   Dg Shoulder Right Portable  Result Date: 12/05/2018 CLINICAL DATA:  Weakness and nausea for 2 days. Right shoulder pain without injury. EXAM: PORTABLE RIGHT SHOULDER COMPARISON:  None. FINDINGS: There is no evidence of fracture or dislocation. There is no evidence of arthropathy or other focal bone abnormality. Soft tissues are unremarkable. IMPRESSION: Negative. Electronically Signed   By: Gerome Sam III M.D   On: 12/05/2018 20:37    Procedures Procedures (including critical care time)  Medications Ordered in ED Medications  sodium chloride flush (NS) 0.9 % injection 3 mL (has no administration in time range)     Initial Impression / Assessment and Plan / ED Course  I have reviewed the triage vital signs and the nursing notes.  Pertinent labs & imaging results that were available during my care of the patient were reviewed by me and  considered in my medical decision making (see chart for details).        Patient presenting today with symptoms suggestive of a Covid-like illness.  Patient's EKG shows no sign of acute cardiac issue, chest x-ray is clear.  Labs are reassuring with stable creatinine, normal lipase.  Patient is well-appearing on exam and vital signs are reassuring.  He is satting 100% on room air and in no acute distress.  Patient swabbed for Covid and given precautions and return precautions. Secondly patient is complaining of pain in his right shoulder.  Suspect most likely rotator cuff injury as he has significant pain with both internal and external rotation of the shoulder.  Only able to extend to 90 degrees before he started having significant pain.  Most of the pain is located in the posterior shoulder.  Patient given diclofenac gel and encouraged to follow-up with PCP for possible physical therapy.  Law M.E Shirline Frees [name] was evaluated in Emergency Department on 12/05/2018 for the symptoms described in the history of present illness. He was evaluated in the context of the global COVID-19 pandemic, which necessitated consideration that the patient might be at risk for infection with the SARS-CoV-2 virus that causes COVID-19. Institutional protocols and algorithms that pertain to the evaluation of patients at risk for COVID-19 are in a state of rapid change based on information released by regulatory bodies including the CDC and federal and state organizations. These policies and algorithms were followed during the patient's care in the ED.   Final Clinical Impressions(s) / ED Diagnoses   Final diagnoses:  COVID-19 ruled out  Chronic right shoulder pain    ED Discharge Orders         Ordered    diclofenac Sodium (VOLTAREN) 1 % GEL  4 times daily     12/05/18 2139           Gwyneth Sprout, MD 12/05/18 2233

## 2018-12-05 NOTE — ED Triage Notes (Signed)
Per EMS- Patient c/o weakness and nausea x 2 days. Parient denies fever, sore throat, cough. Patient also reported that he visited [place] x 4 days and returned home 3 days ago.

## 2018-12-06 ENCOUNTER — Encounter: Payer: Self-pay | Admitting: Internal Medicine

## 2018-12-06 LAB — SARS CORONAVIRUS 2 (TAT 6-24 HRS): SARS Coronavirus 2: POSITIVE — AB

## 2018-12-08 ENCOUNTER — Telehealth: Payer: Self-pay | Admitting: Nurse Practitioner

## 2018-12-08 ENCOUNTER — Telehealth (HOSPITAL_COMMUNITY): Payer: Self-pay

## 2018-12-08 NOTE — Telephone Encounter (Signed)
Called to Discuss with patient about Covid symptoms and the use of bamlanivimab, a monoclonal antibody infusion for those with mild to moderate Covid symptoms and at a high risk of hospitalization.     Pt is qualified for this infusion at the [facility] infusion center due to co-morbid conditions and/or a member of an at-risk group.    Patient is being managed for the following: Patient Active Problem List   Diagnosis Date Noted  . Controlled type 2 diabetes mellitus without complication, without long-term current use of insulin ([place]) 11/01/2016  . Essential hypertension 11/01/2016  . Hyperlipidemia 11/01/2016      Unable to reach pt

## 2018-12-09 ENCOUNTER — Other Ambulatory Visit: Payer: Self-pay | Admitting: Internal Medicine

## 2018-12-09 MED ORDER — BENZONATATE 100 MG PO CAPS: 100.0000 mg | ORAL_CAPSULE | Freq: Three times a day (TID) | ORAL | 0 refills | Status: DC | PRN

## 2018-12-09 MED ORDER — ALBUTEROL SULFATE HFA 108 (90 BASE) MCG/ACT IN AERS: 2.0000 | INHALATION_SPRAY | Freq: Four times a day (QID) | RESPIRATORY_TRACT | 0 refills | Status: DC | PRN

## 2019-02-21 ENCOUNTER — Ambulatory Visit: Payer: Self-pay | Admitting: Cardiovascular Disease

## 2019-02-21 ENCOUNTER — Ambulatory Visit: Payer: Self-pay

## 2019-02-21 ENCOUNTER — Other Ambulatory Visit: Payer: Self-pay

## 2019-02-21 VITALS — BP 148/86 | HR 86 | Temp 99.0°F | Wt 187.0 lb

## 2019-02-21 DIAGNOSIS — E1165 Type 2 diabetes mellitus with hyperglycemia: Secondary | ICD-10-CM

## 2019-02-21 DIAGNOSIS — E781 Pure hyperglyceridemia: Secondary | ICD-10-CM

## 2019-02-21 MED ORDER — FENOFIBRATE 145 MG PO TABS: 145.0000 mg | ORAL_TABLET | Freq: Every day | ORAL | 2 refills | Status: DC

## 2019-02-21 NOTE — Progress Notes (Signed)
Virtual Visit via Telephone Note   This visit type was conducted due to national recommendations for restrictions regarding the COVID-19 Pandemic (e.g. social distancing) in an effort to limit this patient's exposure and mitigate transmission in our community.  Due to his co-morbid illnesses, this patient is at least at moderate risk for complications without adequate follow up.  This format is felt to be most appropriate for this patient at this time.  The patient did not have access to video technology/had technical difficulties with video requiring transitioning to audio format only (telephone).  All issues noted in this document were discussed and addressed.  No physical exam could be performed with this format.  Please refer to the patient's chart for his  consent to telehealth for [facility].   Date:  02/21/2019   ID:  Todd Barnes, [place] Apr 03, 1960, MRN [id number]  Patient Location: Home Provider Location: Home  PCP:  Ladell Pier, MD  Cardiologist:  No primary care provider on file.  Electrophysiologist:  None   Evaluation Performed:  Follow-Up Visit  Chief Complaint:  refills  History of Present Illness:    [place] M.E Earlie Server Todd Barnes is a 59 y.o. male who was reached via phone today for a follow-up visit and prescription refills.  He has known history of essential hypertension, diabetes mellitus and hyperlipidemia. He had COVID-19 in December but recovered completely with no residual symptoms.  Most recent labs showed elevated hemoglobin A1c above 10 and triglyceride above 500.  Additional Metformin was added and fenofibrate was also added to his medications.  He is feeling well with no chest pain, shortness of breath or palpitations.  The patient does not have symptoms concerning for COVID-19 infection (fever, chills, cough, or new shortness of breath).    Past Medical History:  Diagnosis Date  . Diabetes mellitus without complication ([place])   . Hypertension     No past surgical history on file.   No outpatient medications have been marked as taking for the 02/21/19 encounter (Appointment) with [place], MD.     Allergies:   Patient has no known allergies.   Social History   Tobacco Use  . Smoking status: Never Smoker  . Smokeless tobacco: Never Used  Substance Use Topics  . Alcohol use: No  . Drug use: No     Family Hx: The patient's family history includes Diabetes in his mother.  ROS:   Please see the history of present illness.     All other systems reviewed and are negative.   Prior CV studies:   The following studies were reviewed today:    Labs/Other Tests and Data Reviewed:    EKG:  An ECG dated December 2020 was personally reviewed today and demonstrated:  Normal sinus rhythm with no significant ST or T wave changes  Recent Labs: 12/05/2018: ALT 20; BUN 19; Creatinine, Ser 1.03; Hemoglobin 12.0; Platelets 213; Potassium 3.9; Sodium 131   Recent Lipid Panel Lab Results  Component Value Date/Time   CHOL 173 11/07/2018 05:07 PM   TRIG 561 (HH) 11/07/2018 05:07 PM   HDL 34 (L) 11/07/2018 05:07 PM   CHOLHDL 5.1 (H) 11/07/2018 05:07 PM   LDLCALC 56 11/07/2018 05:07 PM    Wt Readings from Last 3 Encounters:  12/05/18 180 lb (81.6 kg)  11/07/18 185 lb 3.2 oz (84 kg)  02/01/18 182 lb 12.8 oz (82.9 kg)     Objective:    Vital Signs:  There were no vitals taken  for this visit.   VITAL SIGNS:  reviewed  ASSESSMENT & PLAN:    1. Essential hypertension: He reports that his blood pressure is well controlled at home on atenolol and lisinopril-hydrochlorothiazide.  Continue same medications. 2. Type 2 diabetes: Uncontrolled with most recent hemoglobin A1c above 10.  Since then, Metformin 500 mg twice daily was added to Januvia and he is also taking glimepiride.  I am going to repeat hemoglobin A1c. 3. Mixed hyperlipidemia: Elevated triglyceride likely due to uncontrolled diabetes.  Since then, he has been  on fenofibrate.  I requested a follow-up lipid and liver profile.  I discussed with him the importance of healthy diet.   Time:   Today, I have spent 10 minutes with the patient with telehealth technology discussing the above problems.     Medication Adjustments/Labs and Tests Ordered: Current medicines are reviewed at length with the patient today.  Concerns regarding medicines are outlined above.   Tests Ordered: No orders of the defined types were placed in this encounter.   Medication Changes: No orders of the defined types were placed in this encounter.   Follow Up:  Either In Person or Virtual in 4 month(s)  Signed, Lorine Bears, MD  02/21/2019 8:35 AM    [facility]

## 2019-02-21 NOTE — Progress Notes (Signed)
Pt has a h/o of high blood pressure and diabetes. Pt is currently out of work and often eats out of boredom. Set goal to eat on schedule with the goal of eating every 3-4 hours throughout the day. Eat within 1 hour of waking up in the morning. Suggested boiled eggs and tea.  Add protein to midmorning meal such as hummus or beans. Limit beans to 1 cup. Keep dinner and lunch the same. Take time to walk outside 4-5 times per week. Follow up appointment in 3 months. Check A1C in 3 months.

## 2019-03-07 ENCOUNTER — Other Ambulatory Visit: Payer: Self-pay

## 2019-03-07 ENCOUNTER — Ambulatory Visit: Payer: Self-pay | Admitting: Internal Medicine

## 2019-03-07 DIAGNOSIS — M25511 Pain in right shoulder: Secondary | ICD-10-CM

## 2019-03-07 DIAGNOSIS — I10 Essential (primary) hypertension: Secondary | ICD-10-CM

## 2019-03-07 DIAGNOSIS — E781 Pure hyperglyceridemia: Secondary | ICD-10-CM

## 2019-03-07 DIAGNOSIS — E785 Hyperlipidemia, unspecified: Secondary | ICD-10-CM

## 2019-03-07 DIAGNOSIS — E119 Type 2 diabetes mellitus without complications: Secondary | ICD-10-CM

## 2019-03-07 DIAGNOSIS — E1165 Type 2 diabetes mellitus with hyperglycemia: Secondary | ICD-10-CM

## 2019-03-07 NOTE — Progress Notes (Signed)
[facility]  Internal MEDICINE  Telephone Visit  Patient Name: Todd Barnes  [id number]  [id number]  Date of Service: 03/07/2019  I connected with the patient at 1124 am by telephone and verified the patients identity using two identifiers.   I discussed the limitations, risks, security and privacy concerns of performing an evaluation and management service by telephone and the availability of in person appointments. I also discussed with the patient that there may be a patient responsible charge related to the service.  The patient expressed understanding and agrees to proceed.    Chief Complaint  Patient presents with  . Hypertension  . Hyperlipidemia  . Diabetes  . Shoulder Pain    HPI  Pt is c/o right shoulder pain, he went to ED and had X-rays done which was normal, he is in pain most of the time, unable to move, last hg a1c in Nov 2020 was 10.0. He is not too sure about his medications.  Denies any c/p or sob   Current Medication: Outpatient Encounter Medications as of 03/07/2019  Medication Sig  . albuterol (VENTOLIN HFA) 108 (90 Base) MCG/ACT inhaler Inhale 2 puffs into the lungs every 6 (six) hours as needed for wheezing or shortness of breath.  Marland Kitchen aspirin 81 MG chewable tablet Chew 81 mg by mouth daily.  Marland Kitchen atenolol (TENORMIN) 50 MG tablet Take 1 tablet (50 mg total) by mouth daily.  . benzonatate (TESSALON) 100 MG capsule Take 1 capsule (100 mg total) by mouth 3 (three) times daily as needed for cough.  . diclofenac Sodium (VOLTAREN) 1 % GEL Apply 4 g topically 4 (four) times daily.  . fenofibrate (TRICOR) 145 MG tablet Take 1 tablet (145 mg total) by mouth daily.  Marland Kitchen glimepiride (AMARYL) 4 MG tablet Take 1 tablet (4 mg total) by mouth 2 (two) times daily.  Marland Kitchen lisinopril-hydrochlorothiazide (ZESTORETIC) 20-12.5 MG tablet 1 tab po bid (Patient taking differently: Take 1 tablet by mouth 2 (two) times daily. )  . lovastatin (MEVACOR) 20 MG tablet Take 1 tablet (20 mg  total) by mouth at bedtime.  . metFORMIN (GLUCOPHAGE) 1000 MG tablet 1/2 tab PO twice a day. (Patient taking differently: Take 500 mg by mouth 2 (two) times daily with a meal. )  . sitaGLIPtin-metformin (JANUMET) 50-500 MG tablet Take 1 tablet by mouth 2 (two) times daily with a meal.   No facility-administered encounter medications on file as of 03/07/2019.    Surgical History: No past surgical history on file.  Medical History: Past Medical History:  Diagnosis Date  . Diabetes mellitus without complication (HCC)   . Hypertension     Family History: Family History  Problem Relation Age of Onset  . Diabetes Mother     Social History   Socioeconomic History  . Marital status: Married    Spouse name: Not on file  . Number of children: Not on file  . Years of education: 54  . Highest education level: Not on file  Occupational History  . Occupation: house keeping  Tobacco Use  . Smoking status: Never Smoker  . Smokeless tobacco: Never Used  Substance and Sexual Activity  . Alcohol use: No  . Drug use: No  . Sexual activity: Never  Other Topics Concern  . Not on file  Social History Narrative  . Not on file   Social Determinants of Health   Financial Resource Strain:   . Difficulty of Paying Living Expenses: Not on file  Food Insecurity:   .  Worried About Charity fundraiser in the Last Year: Not on file  . Ran Out of Food in the Last Year: Not on file  Transportation Needs:   . Lack of Transportation (Medical): Not on file  . Lack of Transportation (Non-Medical): Not on file  Physical Activity:   . Days of Exercise per Week: Not on file  . Minutes of Exercise per Session: Not on file  Stress:   . Feeling of Stress : Not on file  Social Connections:   . Frequency of Communication with Friends and Family: Not on file  . Frequency of Social Gatherings with Friends and Family: Not on file  . Attends Religious Services: Not on file  . Active Member of Clubs or  Organizations: Not on file  . Attends Archivist Meetings: Not on file  . Marital Status: Not on file  Intimate Partner Violence:   . Fear of Current or Ex-Partner: Not on file  . Emotionally Abused: Not on file  . Physically Abused: Not on file  . Sexually Abused: Not on file    Review of Systems  Constitutional: Negative for fever.  Respiratory: Negative for cough.   Cardiovascular: Negative.   Musculoskeletal: Positive for arthralgias and joint swelling.  Hematological: Negative.    Vital Signs: There were no vitals taken for this visit. Not done by pt  Observation/Objective: Pt was in no acute distress and was able to speak, did have a language barrier but daughter was available to translate     Assessment/Plan: 1. Right shoulder pain, unspecified chronicity Seems to be frozen shoulder, will need to see   Ambulatory referral to Orthopedic Surgery  2. Type 2 diabetes mellitus with hyperglycemia, without long-term current use of insulin (HCC) Last hgA1c 10.0, pt will need labs, mailed lab slip  3. Essential hypertension Continue meds   4. Hyperlipidemia, unspecified hyperlipidemia type Continue meds as before  General Counseling: Todd Barnes verbalizes understanding of the findings of today's phone visit and agrees with plan of treatment. I have discussed any further diagnostic evaluation that may be needed or ordered today. We also reviewed his medications today. he has been encouraged to call the office with any questions or concerns that should arise related to todays visit.   Orders Placed This Encounter  Procedures  . Ambulatory referral to Orthopedic Surgery    Time spent: 20Minutes Dr Lavera Guise Internal medicine

## 2019-03-09 ENCOUNTER — Ambulatory Visit: Payer: Medicaid Other | Admitting: Internal Medicine

## 2019-03-17 NOTE — Addendum Note (Signed)
Addended by: Lyndon Code on: 03/17/2019 04:13 PM   Modules accepted: Orders

## 2019-03-18 ENCOUNTER — Ambulatory Visit: Payer: Medicaid Other | Admitting: Pharmacy Technician

## 2019-03-18 DIAGNOSIS — Z79899 Other long term (current) drug therapy: Secondary | ICD-10-CM

## 2019-03-19 NOTE — Progress Notes (Signed)
Completed [facility] application and contract.  Patient agreed to all terms of the [facility] contract.    Nurse, adult, DOH Attestation and Patient Advocate Letter to patient to sign.   Provided patient with Community education officer based on his particular needs.    Patient requested that I contact [facility] to obtain medications.  Sent request to Amal at [facility].    Referred patient for MTM.  Sherilyn Dacosta Care Manager [facility]

## 2019-03-20 ENCOUNTER — Encounter: Payer: Self-pay | Admitting: Internal Medicine

## 2019-03-20 ENCOUNTER — Other Ambulatory Visit: Payer: Self-pay

## 2019-03-20 MED ORDER — DICLOFENAC SODIUM 1 % EX GEL: 4.0000 g | Freq: Four times a day (QID) | CUTANEOUS | 1 refills | Status: DC

## 2019-03-20 MED ORDER — FENOFIBRATE 145 MG PO TABS: 145.0000 mg | ORAL_TABLET | Freq: Every day | ORAL | 2 refills | Status: DC

## 2019-03-20 MED ORDER — METFORMIN HCL 1000 MG PO TABS: ORAL_TABLET | ORAL | 3 refills | Status: DC

## 2019-03-20 MED ORDER — GLIMEPIRIDE 4 MG PO TABS: 4.0000 mg | ORAL_TABLET | Freq: Two times a day (BID) | ORAL | 2 refills | Status: DC

## 2019-03-20 MED ORDER — LISINOPRIL-HYDROCHLOROTHIAZIDE 20-12.5 MG PO TABS: ORAL_TABLET | ORAL | 3 refills | Status: DC

## 2019-03-20 MED ORDER — ATENOLOL 50 MG PO TABS: 50.0000 mg | ORAL_TABLET | Freq: Every day | ORAL | 3 refills | Status: DC

## 2019-03-20 MED ORDER — BENZONATATE 100 MG PO CAPS: 100.0000 mg | ORAL_CAPSULE | Freq: Three times a day (TID) | ORAL | 0 refills | Status: DC | PRN

## 2019-03-20 MED ORDER — ALBUTEROL SULFATE HFA 108 (90 BASE) MCG/ACT IN AERS: 2.0000 | INHALATION_SPRAY | Freq: Four times a day (QID) | RESPIRATORY_TRACT | 0 refills | Status: DC | PRN

## 2019-03-20 NOTE — Addendum Note (Signed)
Addended by: Mordecai Rasmussen on: 03/20/2019 02:29 AM   Modules accepted: Orders

## 2019-03-25 ENCOUNTER — Other Ambulatory Visit: Payer: Self-pay | Admitting: Internal Medicine

## 2019-03-26 LAB — HGB A1C W/O EAG: Hgb A1c MFr Bld: 10.5 % — ABNORMAL HIGH (ref 4.8–5.6)

## 2019-03-26 LAB — MICROALBUMIN / CREATININE URINE RATIO
Creatinine, Urine: 76.4 mg/dL
Microalb/Creat Ratio: 5 mg/g creat (ref 0–29)
Microalbumin, Urine: 4.2 ug/mL

## 2019-03-26 LAB — PSA: Prostate Specific Ag, Serum: 1.7 ng/mL (ref 0.0–4.0)

## 2019-03-31 ENCOUNTER — Telehealth: Payer: Self-pay | Admitting: Pharmacy Technician

## 2019-03-31 NOTE — Telephone Encounter (Signed)
Received updated proof of income.  Patient eligible to receive medication assistance at [facility] until time for re-certification in 9359, and as long as eligibility requirements continue to be met.  [place] [facility]

## 2019-04-03 NOTE — Progress Notes (Signed)
Uncontrolled dm, pt needs to bring all medications on his next follow up

## 2019-04-06 ENCOUNTER — Encounter: Payer: Self-pay | Admitting: Pharmacist

## 2019-04-06 ENCOUNTER — Ambulatory Visit: Payer: Medicaid Other | Admitting: Pharmacist

## 2019-04-06 ENCOUNTER — Other Ambulatory Visit: Payer: Self-pay

## 2019-04-06 DIAGNOSIS — Z79899 Other long term (current) drug therapy: Secondary | ICD-10-CM

## 2019-04-06 NOTE — Progress Notes (Signed)
[facility] Visit Note  Patient: Todd Barnes MRN: [id number] Date of Birth: 02-Sep-1960 PCP: Ladell Pier, MD   [facility] [name] 59 y.o. male, was contacted via telephone with the interpreter [phone]), for a medication therapy management review. He was identified by name and date of birth.  There were no vitals taken for this visit.  Patient Information   Past Medical History:  Diagnosis Date  . Diabetes mellitus without complication ([place])   . Hypertension      No past surgical history on file.   Family History  Problem Relation Age of Onset  . Diabetes Mother     New Diagnoses (since last visit):   Family Support: Good  Lifestyle Diet:  Breakfast: tea, brown bread or egg Lunch: bread, bean Dinner: varies, meat, chicken Drinks: water, tea, no coffee, diet soda occasionally            Social History   Substance and Sexual Activity  Alcohol Use No      Social History   Tobacco Use  Smoking Status Never Smoker  Smokeless Tobacco Never Used      Health Maintenance  Topic Date Due  . HIV Screening  Never done  . OPHTHALMOLOGY EXAM  02/02/2017  . FOOT EXAM  07/27/2017  . INFLUENZA VACCINE  08/02/2019  . HEMOGLOBIN A1C  09/25/2019  . Fecal DNA (Cologuard)  04/19/2020  . TETANUS/TDAP  07/28/2026  . PNEUMOCOCCAL POLYSACCHARIDE VACCINE AGE 20-64 HIGH RISK  Completed  . Hepatitis C Screening  Completed    Outpatient Encounter Medications as of 04/06/2019  Medication Sig  . acetaminophen (TYLENOL) 500 MG tablet Take 500 mg by mouth as needed.   Marland Kitchen aspirin 81 MG chewable tablet Chew 81 mg by mouth daily.  Marland Kitchen atenolol (TENORMIN) 50 MG tablet Take 1 tablet (50 mg total) by mouth daily.  . diclofenac Sodium (VOLTAREN) 1 % GEL Apply 4 g topically 4 (four) times daily.  . fenofibrate (TRICOR) 145 MG tablet Take 1 tablet (145 mg total) by mouth daily.  Marland Kitchen glimepiride (AMARYL) 4 MG tablet Take 1 tablet (4 mg total) by  mouth 2 (two) times daily.  Marland Kitchen lisinopril-hydrochlorothiazide (ZESTORETIC) 20-12.5 MG tablet 1 tab po bid  . lovastatin (MEVACOR) 20 MG tablet Take 1 tablet (20 mg total) by mouth at bedtime.  . metFORMIN (GLUCOPHAGE) 500 MG tablet Take 500 mg by mouth 2 (two) times daily.  . [DISCONTINUED] metFORMIN (GLUCOPHAGE) 1000 MG tablet 1/2 tab PO twice a day. (Patient taking differently: 1/2 tab PO twice a day. (Pt is using the 500mg  tab, twice daily))  . benzonatate (TESSALON) 100 MG capsule Take 1 capsule (100 mg total) by mouth 3 (three) times daily as needed for cough. (Patient not taking: Reported on 04/06/2019)  . sitaGLIPtin-metformin (JANUMET) 50-500 MG tablet Take 1 tablet by mouth 2 (two) times daily with a meal. (Patient not taking: Reported on 04/06/2019)  . [DISCONTINUED] albuterol (VENTOLIN HFA) 108 (90 Base) MCG/ACT inhaler Inhale 2 puffs into the lungs every 6 (six) hours as needed for wheezing or shortness of breath. (Patient not taking: Reported on 04/06/2019)   No facility-administered encounter medications on file as of 04/06/2019.    Assessment and Plan:  Compliance:  States he takes his medications everyday and does not miss a dose. [facility] is currently assisting with the following medications: Atenolol, Benzonatate, Fenofibrate, Glimepiride, HCTZ, Lisinopril, and Metformin.  Diabetes: (Metformin, Glimepiride) Fasting am blood glucose was 143 mg/dl today. Denies  any readings below 80 mg/dl. Last A1c was 10.5% on 03/25/19. Patient is on a statin, aspirin and ACE-inhibitor. Not currently on Janumet XR. States he ran out of this 1 week ago. [facility] will request a prescription from [name] so that we can order this through the patient assistance program. Patient is aware this may take up to 6 weeks to obtain.  Hypertension: (Lisinopril, HCTZ, Atenolol)  Not able to assess today.  Hyperlipidemia: (Lovastatin, Fenofibrate)  Patient was a little  confused about the fenofibrate. Stated he was taking twice daily. I reiterated that this is prescribed as 1 tablet daily for his triglycerides. TC = 173 mg/dl; TG = 458 mg/dl; HDL = 34 mg/dl; LDL = 56 mg/dl (09/09/81)  Right Shoulder Pain: (Diclofenac gel 1%) Ambulatory referral to Orthopedic Surgery per note in [facility].   RTC in 1 year  Julie-Anne Torain K. Joelene Millin, PharmD [facility] Clinic-Pharmacy Operations Coordinator [phone]

## 2019-04-13 ENCOUNTER — Other Ambulatory Visit: Payer: Self-pay | Admitting: Cardiovascular Disease

## 2019-04-14 LAB — LIPID PANEL W/O CHOL/HDL RATIO
Cholesterol, Total: 119 mg/dL (ref 100–199)
HDL: 37 mg/dL — ABNORMAL LOW (ref >39)
LDL Chol Calc (NIH): 59 mg/dL (ref 0–99)
Triglycerides: 131 mg/dL (ref 0–149)
VLDL Cholesterol Cal: 23 mg/dL (ref 5–40)

## 2019-04-14 LAB — ALKALINE PHOSPHATASE: Alkaline Phosphatase: 58 IU/L (ref 39–117)

## 2019-04-14 LAB — ALT: ALT: 19 IU/L (ref 0–44)

## 2019-04-14 LAB — HGB A1C W/O EAG: Hgb A1c MFr Bld: 10.8 % — ABNORMAL HIGH (ref 4.8–5.6)

## 2019-04-14 LAB — AST: AST: 19 IU/L (ref 0–40)

## 2019-04-18 ENCOUNTER — Ambulatory Visit: Payer: Self-pay | Admitting: Internal Medicine

## 2019-04-18 ENCOUNTER — Encounter: Payer: Self-pay | Admitting: Internal Medicine

## 2019-04-18 ENCOUNTER — Other Ambulatory Visit: Payer: Self-pay

## 2019-04-18 DIAGNOSIS — E785 Hyperlipidemia, unspecified: Secondary | ICD-10-CM

## 2019-04-18 MED ORDER — LOVASTATIN 20 MG PO TABS: 20.0000 mg | ORAL_TABLET | Freq: Every day | ORAL | 3 refills | Status: DC

## 2019-04-18 MED ORDER — METFORMIN HCL 500 MG PO TABS: 500.0000 mg | ORAL_TABLET | Freq: Two times a day (BID) | ORAL | 1 refills | Status: DC

## 2019-04-18 NOTE — Progress Notes (Signed)
Established Patient Office Visit  Subjective:  Patient ID: Todd Barnes, male    DOB: [birth year]  Age: 59 y.o. MRN: [id number]  CC: No chief complaint on file. follow up  HPI [facility] Todd Barnes presents for virtual visit for  lab and diabetes followup. Diabetes uncontrolled as last a1c 10.2 although he missed two wks of Janumet. He is fasting during Ramadan and states his blood sugar has improved,  Past Medical History:  Diagnosis Date  . Diabetes mellitus without complication (HCC)   . Hyperlipidemia   . Hypertension     No past surgical history on file.  Family History  Problem Relation Age of Onset  . Diabetes Mother     Social History   Socioeconomic History  . Marital status: Married    Spouse name: Not on file  . Number of children: Not on file  . Years of education: 41  . Highest education level: Not on file  Occupational History  . Occupation: house keeping  Tobacco Use  . Smoking status: Never Smoker  . Smokeless tobacco: Never Used  Substance and Sexual Activity  . Alcohol use: No  . Drug use: No  . Sexual activity: Never  Other Topics Concern  . Not on file  Social History Narrative  . Not on file   Social Determinants of Health   Financial Resource Strain:   . Difficulty of Paying Living Expenses:   Food Insecurity:   . Worried About Programme researcher, broadcasting/film/video in the Last Year:   . Barista in the Last Year:   Transportation Needs:   . Freight forwarder (Medical):   Marland Kitchen Lack of Transportation (Non-Medical):   Physical Activity:   . Days of Exercise per Week:   . Minutes of Exercise per Session:   Stress:   . Feeling of Stress :   Social Connections:   . Frequency of Communication with Friends and Family:   . Frequency of Social Gatherings with Friends and Family:   . Attends Religious Services:   . Active Member of Clubs or Organizations:   . Attends Banker Meetings:   Marland Kitchen Marital Status:   Intimate  Partner Violence:   . Fear of Current or Ex-Partner:   . Emotionally Abused:   Marland Kitchen Physically Abused:   . Sexually Abused:     Outpatient Medications Prior to Visit  Medication Sig Dispense Refill  . acetaminophen (TYLENOL) 500 MG tablet Take 500 mg by mouth as needed.     Marland Kitchen aspirin 81 MG chewable tablet Chew 81 mg by mouth daily.    Marland Kitchen atenolol (TENORMIN) 50 MG tablet Take 1 tablet (50 mg total) by mouth daily. 90 tablet 3  . benzonatate (TESSALON) 100 MG capsule Take 1 capsule (100 mg total) by mouth 3 (three) times daily as needed for cough. 30 capsule 0  . diclofenac Sodium (VOLTAREN) 1 % GEL Apply 4 g topically 4 (four) times daily. 100 g 1  . fenofibrate (TRICOR) 145 MG tablet Take 1 tablet (145 mg total) by mouth daily. 90 tablet 2  . glimepiride (AMARYL) 4 MG tablet Take 1 tablet (4 mg total) by mouth 2 (two) times daily. 180 tablet 2  . lisinopril-hydrochlorothiazide (ZESTORETIC) 20-12.5 MG tablet 1 tab po bid 180 tablet 3  . lovastatin (MEVACOR) 20 MG tablet Take 1 tablet (20 mg total) by mouth at bedtime. 90 tablet 3  . metFORMIN (GLUCOPHAGE) 500 MG tablet Take 500 mg by  mouth 2 (two) times daily.    . sitaGLIPtin-metformin (JANUMET) 50-500 MG tablet Take 1 tablet by mouth 2 (two) times daily with a meal. (Patient not taking: Reported on 04/06/2019) 160 tablet 2   No facility-administered medications prior to visit.    No Known Allergies  ROS Review of Systems    Objective:    Physical Exam  There were no vitals taken for this visit. Wt Readings from Last 3 Encounters:  02/21/19 187 lb (84.8 kg)  12/05/18 180 lb (81.6 kg)  11/07/18 185 lb 3.2 oz (84 kg)     Health Maintenance Due  Topic Date Due  . HIV Screening  Never done  . OPHTHALMOLOGY EXAM  02/02/2017  . FOOT EXAM  07/27/2017    There are no preventive care reminders to display for this patient.  Lab Results  Component Value Date   TSH 3.780 01/27/2018   Lab Results  Component Value Date   WBC 3.6  (L) 12/05/2018   HGB 12.0 (L) 12/05/2018   HCT 37.4 (L) 12/05/2018   MCV 82.0 12/05/2018   PLT 213 12/05/2018   Lab Results  Component Value Date   NA 131 (L) 12/05/2018   K 3.9 12/05/2018   CO2 23 12/05/2018   GLUCOSE 195 (H) 12/05/2018   BUN 19 12/05/2018   CREATININE 1.03 12/05/2018   BILITOT 0.5 12/05/2018   ALKPHOS 58 04/13/2019   AST 19 04/13/2019   ALT 19 04/13/2019   PROT 6.9 12/05/2018   ALBUMIN 3.7 12/05/2018   CALCIUM 8.3 (L) 12/05/2018   ANIONGAP 12 12/05/2018   Lab Results  Component Value Date   CHOL 119 04/13/2019   Lab Results  Component Value Date   HDL 37 (L) 04/13/2019   Lab Results  Component Value Date   LDLCALC 59 04/13/2019   Lab Results  Component Value Date   TRIG 131 04/13/2019   Lab Results  Component Value Date   CHOLHDL 5.1 (H) 11/07/2018   Lab Results  Component Value Date   HGBA1C 10.8 (H) 04/13/2019      Assessment & Plan:   Problem List Items Addressed This Visit    None      No orders of the defined types were placed in this encounter. Advised to continue current rx until after end of Ramadan and will follow up in 3 mo after returning from [place].  Follow-up: No follow-ups on file.    I connected with the patient at 1124 am by videocall and verified the patients identity using two identifiers.   I discussed the limitations, risks, security and privacy concerns of performing an evaluation and management service by telephone and the availability of in person appointments. I also discussed with the patient that there may be a patient responsible charge related to the service.  The patient expressed understanding and agrees to proceed.     Volanda Napoleon, MD

## 2019-04-29 ENCOUNTER — Other Ambulatory Visit: Payer: Self-pay | Admitting: Internal Medicine

## 2019-04-30 LAB — HGB A1C W/O EAG: Hgb A1c MFr Bld: 10.2 % — ABNORMAL HIGH (ref 4.8–5.6)

## 2019-05-02 ENCOUNTER — Other Ambulatory Visit: Payer: Self-pay | Admitting: Internal Medicine

## 2019-05-02 ENCOUNTER — Other Ambulatory Visit: Payer: Self-pay

## 2019-05-02 ENCOUNTER — Ambulatory Visit: Payer: Self-pay

## 2019-05-02 DIAGNOSIS — E119 Type 2 diabetes mellitus without complications: Secondary | ICD-10-CM

## 2019-05-02 DIAGNOSIS — E785 Hyperlipidemia, unspecified: Secondary | ICD-10-CM

## 2019-05-02 MED ORDER — LOVASTATIN 20 MG PO TABS: 20.0000 mg | ORAL_TABLET | Freq: Every day | ORAL | 3 refills | Status: DC

## 2019-05-02 MED ORDER — GLIMEPIRIDE 4 MG PO TABS: 4.0000 mg | ORAL_TABLET | Freq: Two times a day (BID) | ORAL | 2 refills | Status: DC

## 2019-05-02 MED ORDER — METFORMIN HCL 500 MG PO TABS: ORAL_TABLET | ORAL | 1 refills | Status: DC

## 2019-05-02 MED ORDER — LISINOPRIL-HYDROCHLOROTHIAZIDE 20-12.5 MG PO TABS: ORAL_TABLET | ORAL | 3 refills | Status: DC

## 2019-05-02 MED ORDER — ATENOLOL 50 MG PO TABS: 50.0000 mg | ORAL_TABLET | Freq: Every day | ORAL | 3 refills | Status: DC

## 2019-05-02 NOTE — Progress Notes (Signed)
Pt has a high HgA1C. During Ramadan the patient has a mixed diet which is high in fats and carbohydrates. We discussed a few ways to decrease these. For foods like sambosas we suggested baking rather than frying. During suhoor the diet is high is carbs (milk, dates, banana, and bread). Here we opted to have oatmeal or yogurt in place of bread to increase protein and fiber rich carbohydrates. We talked about portion sizes for fruits such as 1/2 banana and limiting to 3 dates. Encouraged also to have alittle milk in oatmeal or not have at all to limit carbohydrate intake. Made a suggestion to have chia seeds (1/2 to 1 teaspoon with yogurt or oatmeal) to increase fiber intake, protein, and omega fat acids which should improve overall health for heart and help with blood glucose. Pt has an active job so not encouraged to add in workouts. Pt is taking medications daily and requests 2 month supply as he is going out of the country for 2 months. Recommend scheduling appt in 3 months with HgA1C check.

## 2019-05-02 NOTE — Addendum Note (Signed)
Addended by: Lyndon Code on: 05/02/2019 11:38 AM   Modules accepted: Orders

## 2019-05-16 ENCOUNTER — Ambulatory Visit: Payer: Self-pay | Admitting: Orthopedic Surgery

## 2019-06-20 ENCOUNTER — Ambulatory Visit: Payer: Self-pay | Admitting: Cardiovascular Disease

## 2019-07-18 ENCOUNTER — Ambulatory Visit: Payer: Self-pay | Admitting: Internal Medicine

## 2019-07-25 ENCOUNTER — Ambulatory Visit: Payer: Self-pay | Admitting: Internal Medicine

## 2019-08-04 ENCOUNTER — Other Ambulatory Visit: Payer: Self-pay | Admitting: Internal Medicine

## 2019-08-05 LAB — HGB A1C W/O EAG: Hgb A1c MFr Bld: 10.9 % — ABNORMAL HIGH (ref 4.8–5.6)

## 2019-08-08 ENCOUNTER — Other Ambulatory Visit: Payer: Self-pay | Admitting: Cardiovascular Disease

## 2019-08-08 ENCOUNTER — Ambulatory Visit: Payer: Self-pay

## 2019-08-08 ENCOUNTER — Other Ambulatory Visit: Payer: Self-pay

## 2019-08-08 ENCOUNTER — Ambulatory Visit: Payer: Self-pay | Admitting: Cardiovascular Disease

## 2019-08-08 ENCOUNTER — Ambulatory Visit: Payer: Self-pay | Admitting: Internal Medicine

## 2019-08-08 DIAGNOSIS — I10 Essential (primary) hypertension: Secondary | ICD-10-CM

## 2019-08-08 DIAGNOSIS — E1165 Type 2 diabetes mellitus with hyperglycemia: Secondary | ICD-10-CM

## 2019-08-08 MED ORDER — SITAGLIPTIN PHOS-METFORMIN HCL 50-1000 MG PO TABS: 1.0000 | ORAL_TABLET | Freq: Two times a day (BID) | ORAL | 2 refills | Status: DC

## 2019-08-08 NOTE — Progress Notes (Signed)
Follow up:  Travelled to [place] mid-May to end of July. Diet inconsistent in terms of timing and also what he was eating. Not a lot of healthy options.   Diet: Breakfast: 7 am, wakes up at 6 am, has brown bread with butter, sometimes 1 eggs cooked different ways. Tea with 1% milk with 1 splenda.   Lunch: 1-2 pm salad (tomatoes, onion, cucumber, lettuce, lemon whatever is in the home) with soup (vegetable or chicken or meat or bean)  Dinner: 5-6 pm Beans everyday (different types) with salad, brown bread mostly sometimes white, once awhile will have rice, sometimes drinks coke zero, mostly water  Night snack: 8 pm milk and tea with splenda with fruit (apple, orange, mango)  Physical Activity: Every other day run or bike for 25 minutes, 3-4 times a week  Medications: consistently take  Goals: 2 eggs at breakfast, add a snack of yogurt, fruit, and green tea midday, and night time snack we will add yogurt or cheese  Follow up visit: 3 months and schedule HgA1c check prior to next appt

## 2019-08-08 NOTE — Progress Notes (Signed)
[facility]  Virtual Visit via Telephone Note     Date:  08/08/2019   ID:  Todd Barnes, [place] 1960/01/23, MRN [id number]  Patient Location: Home Provider Location: Home  PCP:  Marcine Matar, MD  Cardiologist:  No primary care provider on file.  Electrophysiologist:  None   Evaluation Performed:  Follow-Up Visit  Chief Complaint:  refills  History of Present Illness:    Todd Barnes is a 59 y.o. male who was reached via phone today for a follow-up visit and prescription refills.  He has known history of essential hypertension, diabetes mellitus and hyperlipidemia.  He recently visited his home country [place] and could not follow good diabetic diet.  His hemoglobin A1c recently was still above 10.  Since he came back, he improved his diet.  He was taking Janumet once daily instead of twice daily because he was worried about running out of the medication.  He denies chest pain or shortness of breath.  Blood pressures under excellent control at home.  Also has hyperlipidemia improved with treatment.    Past Medical History:  Diagnosis Date  . Diabetes mellitus without complication (HCC)   . Hyperlipidemia   . Hypertension    No past surgical history on file.   No outpatient medications have been marked as taking for the 08/08/19 encounter (Appointment) with [place] Ouch, MD.     Allergies:   Patient has no known allergies.   Social History   Tobacco Use  . Smoking status: Never Smoker  . Smokeless tobacco: Never Used  Vaping Use  . Vaping Use: Never used  Substance Use Topics  . Alcohol use: No  . Drug use: No     Family Hx: The patient's family history includes Diabetes in his mother.  ROS:   Please see the history of present illness.     All other systems reviewed and are negative.   Prior CV studies:   The following studies were reviewed today:    Labs/Other Tests and Data Reviewed:    EKG:  An ECG dated December 2020  was personally reviewed today and demonstrated:  Normal sinus rhythm with no significant ST or T wave changes  Recent Labs: 12/05/2018: BUN 19; Creatinine, Ser 1.03; Hemoglobin 12.0; Platelets 213; Potassium 3.9; Sodium 131 04/13/2019: ALT 19   Recent Lipid Panel Lab Results  Component Value Date/Time   CHOL 119 04/13/2019 10:46 AM   TRIG 131 04/13/2019 10:46 AM   HDL 37 (L) 04/13/2019 10:46 AM   CHOLHDL 5.1 (H) 11/07/2018 05:07 PM   LDLCALC 59 04/13/2019 10:46 AM    Wt Readings from Last 3 Encounters:  02/21/19 187 lb (84.8 kg)  12/05/18 180 lb (81.6 kg)  11/07/18 185 lb 3.2 oz (84 kg)     Objective:    Vital Signs:  There were no vitals taken for this visit.   VITAL SIGNS:  reviewed  ASSESSMENT & PLAN:    1. Essential hypertension: He reports that his blood pressure is well controlled at home on atenolol and lisinopril-hydrochlorothiazide.  Continue same medications. 2. Type 2 diabetes: Still uncontrolled with hemoglobin A1c above 10.  He attributes this to poor diet while he was out of the country.  He also was taking Janumet once daily instead of twice daily as he was afraid to run out of the medication.  I refill Janumet today and discussed with him the importance of healthy diet and regular exercise.  If his diabetes  remains uncontrolled with oral medications, he might need insulin.  . 3. Mixed hyperlipidemia: Most recent lipid profile showed normal triglyceride and LDL sense fenofibrate was added.     Time:   Today, I have spent 10 minutes with the patient with telehealth technology discussing the above problems.     Medication Adjustments/Labs and Tests Ordered: Current medicines are reviewed at length with the patient today.  Concerns regarding medicines are outlined above.   Tests Ordered: No orders of the defined types were placed in this encounter.   Medication Changes: No orders of the defined types were placed in this encounter.   Follow Up:  Either In  Person or Virtual in 3 months  Signed, Lorine Bears, MD  08/08/2019 9:08 AM    [facility]

## 2019-08-12 ENCOUNTER — Other Ambulatory Visit: Payer: Self-pay | Admitting: Internal Medicine

## 2019-08-22 ENCOUNTER — Other Ambulatory Visit: Payer: Self-pay | Admitting: Internal Medicine

## 2019-08-22 ENCOUNTER — Ambulatory Visit: Payer: Self-pay | Admitting: Family Medicine

## 2019-08-22 ENCOUNTER — Other Ambulatory Visit: Payer: Self-pay

## 2019-08-22 ENCOUNTER — Ambulatory Visit: Payer: Self-pay | Admitting: Internal Medicine

## 2019-08-22 ENCOUNTER — Encounter: Payer: Self-pay | Admitting: Internal Medicine

## 2019-08-22 DIAGNOSIS — E119 Type 2 diabetes mellitus without complications: Secondary | ICD-10-CM

## 2019-08-22 DIAGNOSIS — E781 Pure hyperglyceridemia: Secondary | ICD-10-CM

## 2019-08-22 DIAGNOSIS — E785 Hyperlipidemia, unspecified: Secondary | ICD-10-CM

## 2019-08-22 MED ORDER — ACETAMINOPHEN 500 MG PO TABS: 500.0000 mg | ORAL_TABLET | Freq: Four times a day (QID) | ORAL | 1 refills | Status: DC | PRN

## 2019-08-22 MED ORDER — SITAGLIPTIN PHOS-METFORMIN HCL 50-1000 MG PO TABS: 1.0000 | ORAL_TABLET | Freq: Two times a day (BID) | ORAL | 2 refills | Status: DC

## 2019-08-22 MED ORDER — FENOFIBRATE 145 MG PO TABS: 145.0000 mg | ORAL_TABLET | Freq: Every day | ORAL | 2 refills | Status: DC

## 2019-08-22 MED ORDER — GLIMEPIRIDE 4 MG PO TABS: 4.0000 mg | ORAL_TABLET | Freq: Two times a day (BID) | ORAL | 2 refills | Status: DC

## 2019-08-22 MED ORDER — DICLOFENAC SODIUM 1 % EX GEL
4.0000 g | Freq: Four times a day (QID) | CUTANEOUS | 1 refills | Status: DC
Start: 2019-08-22 — End: 2019-11-07

## 2019-08-22 MED ORDER — LISINOPRIL-HYDROCHLOROTHIAZIDE 20-12.5 MG PO TABS
ORAL_TABLET | ORAL | 3 refills | Status: DC
Start: 2019-08-22 — End: 2023-04-08

## 2019-08-22 MED ORDER — LOVASTATIN 20 MG PO TABS: 20.0000 mg | ORAL_TABLET | Freq: Every day | ORAL | 3 refills | Status: DC

## 2019-08-22 MED ORDER — ASPIRIN 81 MG PO CHEW
81.0000 mg | CHEWABLE_TABLET | Freq: Every day | ORAL | 3 refills | Status: DC
Start: 2019-08-22 — End: 2019-08-22

## 2019-08-22 MED ORDER — ATENOLOL 50 MG PO TABS
50.0000 mg | ORAL_TABLET | Freq: Every day | ORAL | 3 refills | Status: DC
Start: 2019-08-22 — End: 2019-08-22

## 2019-08-22 NOTE — Progress Notes (Signed)
Established Patient Office Visit  Subjective:  Patient ID: Todd Barnes, male    DOB: 1960/07/20  Age: 59 y.o. MRN: [id number]  CC: Medication refill.  HPI [facility] [name] presents for medication refill.  He has no new complaint today.  This was a televisit, no physical exam done.  Past Medical History:  Diagnosis Date   Diabetes mellitus without complication (HCC)    Hyperlipidemia    Hypertension     No past surgical history on file.  Family History  Problem Relation Age of Onset   Diabetes Mother     Social History   Socioeconomic History   Marital status: Married    Spouse name: Not on file   Number of children: Not on file   Years of education: 16   Highest education level: Not on file  Occupational History   Occupation: house keeping  Tobacco Use   Smoking status: Never Smoker   Smokeless tobacco: Never Used  Building services engineer Use: Never used  Substance and Sexual Activity   Alcohol use: No   Drug use: No   Sexual activity: Never  Other Topics Concern   Not on file  Social History Narrative   Not on file   Social Determinants of Health   Financial Resource Strain:    Difficulty of Paying Living Expenses: Not on file  Food Insecurity:    Worried About Programme researcher, broadcasting/film/video in the Last Year: Not on file   [employer] of Food in the Last Year: Not on file  Transportation Needs:    Lack of Transportation (Medical): Not on file   Lack of Transportation (Non-Medical): Not on file  Physical Activity:    Days of Exercise per Week: Not on file   Minutes of Exercise per Session: Not on file  Stress:    Feeling of Stress : Not on file  Social Connections:    Frequency of Communication with Friends and Family: Not on file   Frequency of Social Gatherings with Friends and Family: Not on file   Attends Religious Services: Not on file   Active Member of Clubs or Organizations: Not on file   Attends Tax inspector Meetings: Not on file   Marital Status: Not on file  Intimate Partner Violence:    Fear of Current or Ex-Partner: Not on file   Emotionally Abused: Not on file   Physically Abused: Not on file   Sexually Abused: Not on file    Outpatient Medications Prior to Visit  Medication Sig Dispense Refill   acetaminophen (TYLENOL) 500 MG tablet Take 500 mg by mouth as needed.      aspirin 81 MG chewable tablet Chew 81 mg by mouth daily.     atenolol (TENORMIN) 50 MG tablet Take 1 tablet (50 mg total) by mouth daily. 90 tablet 3   benzonatate (TESSALON) 100 MG capsule Take 1 capsule (100 mg total) by mouth 3 (three) times daily as needed for cough. 30 capsule 0   diclofenac Sodium (VOLTAREN) 1 % GEL Apply 4 g topically 4 (four) times daily. 100 g 1   fenofibrate (TRICOR) 145 MG tablet Take 1 tablet (145 mg total) by mouth daily. 90 tablet 2   glimepiride (AMARYL) 4 MG tablet Take 1 tablet (4 mg total) by mouth 2 (two) times daily. 180 tablet 2   lisinopril-hydrochlorothiazide (ZESTORETIC) 20-12.5 MG tablet 1 tab po bid 180 tablet 3   lovastatin (MEVACOR) 20 MG  tablet Take 1 tablet (20 mg total) by mouth at bedtime. 90 tablet 3   metFORMIN (GLUCOPHAGE) 500 MG tablet Take one tab in am and 2 in pm 180 tablet 1   sitaGLIPtin-metformin (JANUMET) 50-1000 MG tablet Take 1 tablet by mouth 2 (two) times daily with a meal. 180 tablet 2   No facility-administered medications prior to visit.    No Known Allergies  ROS Review of Systems    Objective:    Physical Exam  There were no vitals taken for this visit. Wt Readings from Last 3 Encounters:  02/21/19 187 lb (84.8 kg)  12/05/18 180 lb (81.6 kg)  11/07/18 185 lb 3.2 oz (84 kg)     Health Maintenance Due  Topic Date Due   COVID-19 Vaccine (1) Never done   HIV Screening  Never done   OPHTHALMOLOGY EXAM  02/02/2017   FOOT EXAM  07/27/2017   INFLUENZA VACCINE  08/02/2019    There are no preventive care  reminders to display for this patient.  Lab Results  Component Value Date   TSH 3.780 01/27/2018   Lab Results  Component Value Date   WBC 3.6 (L) 12/05/2018   HGB 12.0 (L) 12/05/2018   HCT 37.4 (L) 12/05/2018   MCV 82.0 12/05/2018   PLT 213 12/05/2018   Lab Results  Component Value Date   NA 131 (L) 12/05/2018   K 3.9 12/05/2018   CO2 23 12/05/2018   GLUCOSE 195 (H) 12/05/2018   BUN 19 12/05/2018   CREATININE 1.03 12/05/2018   BILITOT 0.5 12/05/2018   ALKPHOS 58 04/13/2019   AST 19 04/13/2019   ALT 19 04/13/2019   PROT 6.9 12/05/2018   ALBUMIN 3.7 12/05/2018   CALCIUM 8.3 (L) 12/05/2018   ANIONGAP 12 12/05/2018   Lab Results  Component Value Date   CHOL 119 04/13/2019   Lab Results  Component Value Date   HDL 37 (L) 04/13/2019   Lab Results  Component Value Date   LDLCALC 59 04/13/2019   Lab Results  Component Value Date   TRIG 131 04/13/2019   Lab Results  Component Value Date   CHOLHDL 5.1 (H) 11/07/2018   Lab Results  Component Value Date   HGBA1C 10.9 (H) 08/04/2019      Assessment & Plan:  Medications reviewed and refills were provided to medication management clinic. There was Janumet and Metformin both listed in his chart, the Metformin was discontinued and he was provided with refill for Janumet.   Problem List Items Addressed This Visit      Endocrine   Controlled type 2 diabetes mellitus without complication, without long-term current use of insulin (HCC) (Chronic)   Relevant Medications   sitaGLIPtin-metformin (JANUMET) 50-1000 MG tablet   lisinopril-hydrochlorothiazide (ZESTORETIC) 20-12.5 MG tablet   lovastatin (MEVACOR) 20 MG tablet   aspirin 81 MG chewable tablet   glimepiride (AMARYL) 4 MG tablet     Other   Hyperlipidemia (Chronic)   Relevant Medications   lisinopril-hydrochlorothiazide (ZESTORETIC) 20-12.5 MG tablet   atenolol (TENORMIN) 50 MG tablet   fenofibrate (TRICOR) 145 MG tablet   lovastatin (MEVACOR) 20 MG  tablet   aspirin 81 MG chewable tablet    Other Visit Diagnoses    Hypertriglyceridemia       Relevant Medications   lisinopril-hydrochlorothiazide (ZESTORETIC) 20-12.5 MG tablet   atenolol (TENORMIN) 50 MG tablet   fenofibrate (TRICOR) 145 MG tablet   lovastatin (MEVACOR) 20 MG tablet   aspirin 81 MG chewable tablet  Meds ordered this encounter  Medications   acetaminophen (TYLENOL) 500 MG tablet    Sig: Take 1 tablet (500 mg total) by mouth every 6 (six) hours as needed for mild pain, moderate pain, fever or headache.    Dispense:  90 tablet    Refill:  1   sitaGLIPtin-metformin (JANUMET) 50-1000 MG tablet    Sig: Take 1 tablet by mouth 2 (two) times daily with a meal.    Dispense:  180 tablet    Refill:  2   lisinopril-hydrochlorothiazide (ZESTORETIC) 20-12.5 MG tablet    Sig: 1 tab po bid    Dispense:  180 tablet    Refill:  3   diclofenac Sodium (VOLTAREN) 1 % GEL    Sig: Apply 4 g topically 4 (four) times daily.    Dispense:  100 g    Refill:  1   atenolol (TENORMIN) 50 MG tablet    Sig: Take 1 tablet (50 mg total) by mouth daily.    Dispense:  90 tablet    Refill:  3   fenofibrate (TRICOR) 145 MG tablet    Sig: Take 1 tablet (145 mg total) by mouth daily.    Dispense:  90 tablet    Refill:  2   lovastatin (MEVACOR) 20 MG tablet    Sig: Take 1 tablet (20 mg total) by mouth at bedtime.    Dispense:  90 tablet    Refill:  3   aspirin 81 MG chewable tablet    Sig: Chew 1 tablet (81 mg total) by mouth daily.    Dispense:  90 tablet    Refill:  3   glimepiride (AMARYL) 4 MG tablet    Sig: Take 1 tablet (4 mg total) by mouth 2 (two) times daily.    Dispense:  180 tablet    Refill:  2    Follow-up: No follow-ups on file.    Arnetha Courser, MD

## 2019-08-24 ENCOUNTER — Telehealth: Payer: Self-pay | Admitting: Pharmacist

## 2019-08-24 NOTE — Telephone Encounter (Signed)
08/24/2019 11:53:16 AM - Janumet pending  -- Rhetta Mura - Monday, August 24, 2019 11:52 AM --I have received the patient's portion of [employer] application for Janumet, holding for provider portion to be returned.

## 2019-09-17 ENCOUNTER — Telehealth: Payer: Self-pay | Admitting: Pharmacist

## 2019-09-17 NOTE — Telephone Encounter (Signed)
09/17/2019 12:13:10 PM - Janumet form mailed to [employer]  -- Rhetta Mura - Thursday, September 17, 2019 12:12 PM --Retail banker for [employer] 50/1000 Take 1 tablet 2 times a day.

## 2019-09-19 ENCOUNTER — Ambulatory Visit: Payer: Self-pay | Admitting: Orthopedic Surgery

## 2019-09-19 ENCOUNTER — Ambulatory Visit: Payer: Self-pay | Admitting: Internal Medicine

## 2019-11-03 ENCOUNTER — Other Ambulatory Visit: Payer: Self-pay | Admitting: Internal Medicine

## 2019-11-04 LAB — HGB A1C W/O EAG: Hgb A1c MFr Bld: 11 % — ABNORMAL HIGH (ref 4.8–5.6)

## 2019-11-07 ENCOUNTER — Ambulatory Visit: Payer: Self-pay | Admitting: Internal Medicine

## 2019-11-07 ENCOUNTER — Other Ambulatory Visit: Payer: Self-pay

## 2019-11-07 DIAGNOSIS — E1165 Type 2 diabetes mellitus with hyperglycemia: Secondary | ICD-10-CM | POA: Insufficient documentation

## 2019-11-07 DIAGNOSIS — E785 Hyperlipidemia, unspecified: Secondary | ICD-10-CM

## 2019-11-07 DIAGNOSIS — I10 Essential (primary) hypertension: Secondary | ICD-10-CM

## 2019-11-07 MED ORDER — CANAGLIFLOZIN 100 MG PO TABS: 100.0000 mg | ORAL_TABLET | Freq: Every day | ORAL | 2 refills | Status: AC

## 2019-11-07 NOTE — Progress Notes (Signed)
He A1c levels is too high = 11.0 % Reported on 11/02 2021

## 2019-11-07 NOTE — Progress Notes (Signed)
Established Patient Office Visit  Subjective:  Patient ID: Todd Barnes, male    DOB: Jan 14, 1960  Age: 59 y.o. MRN: [id number]  CC: Uncontrolled type 2 Diabetes.  HPI [facility] Todd Barnes presents for F/U for DM. A1c 11/2 was  11.0. He is on Janumet and glimepiride.   Past Medical History:  Diagnosis Date  . Diabetes mellitus without complication (HCC)   . Hyperlipidemia   . Hypertension     No past surgical history on file.  Family History  Problem Relation Age of Onset  . Diabetes Mother     Social History   Socioeconomic History  . Marital status: Married    Spouse name: Not on file  . Number of children: Not on file  . Years of education: 70  . Highest education level: Not on file  Occupational History  . Occupation: house keeping  Tobacco Use  . Smoking status: Never Smoker  . Smokeless tobacco: Never Used  Vaping Use  . Vaping Use: Never used  Substance and Sexual Activity  . Alcohol use: No  . Drug use: No  . Sexual activity: Never  Other Topics Concern  . Not on file  Social History Narrative  . Not on file   Social Determinants of Health   Financial Resource Strain:   . Difficulty of Paying Living Expenses: Not on file  Food Insecurity:   . Worried About Programme researcher, broadcasting/film/video in the Last Year: Not on file  . Ran Out of Food in the Last Year: Not on file  Transportation Needs:   . Lack of Transportation (Medical): Not on file  . Lack of Transportation (Non-Medical): Not on file  Physical Activity:   . Days of Exercise per Week: Not on file  . Minutes of Exercise per Session: Not on file  Stress:   . Feeling of Stress : Not on file  Social Connections:   . Frequency of Communication with Friends and Family: Not on file  . Frequency of Social Gatherings with Friends and Family: Not on file  . Attends Religious Services: Not on file  . Active Member of Clubs or Organizations: Not on file  . Attends Banker  Meetings: Not on file  . Marital Status: Not on file  Intimate Partner Violence:   . Fear of Current or Ex-Partner: Not on file  . Emotionally Abused: Not on file  . Physically Abused: Not on file  . Sexually Abused: Not on file    Outpatient Medications Prior to Visit  Medication Sig Dispense Refill  . acetaminophen (TYLENOL) 500 MG tablet Take 1 tablet (500 mg total) by mouth every 6 (six) hours as needed for mild pain, moderate pain, fever or headache. 90 tablet 1  . aspirin 81 MG chewable tablet Chew 1 tablet (81 mg total) by mouth daily. 90 tablet 3  . atenolol (TENORMIN) 50 MG tablet Take 1 tablet (50 mg total) by mouth daily. 90 tablet 3  . fenofibrate (TRICOR) 145 MG tablet Take 1 tablet (145 mg total) by mouth daily. 90 tablet 2  . glimepiride (AMARYL) 4 MG tablet Take 1 tablet (4 mg total) by mouth 2 (two) times daily. 180 tablet 2  . lisinopril-hydrochlorothiazide (ZESTORETIC) 20-12.5 MG tablet 1 tab po bid 180 tablet 3  . lovastatin (MEVACOR) 20 MG tablet Take 1 tablet (20 mg total) by mouth at bedtime. 90 tablet 3  . sitaGLIPtin-metformin (JANUMET) 50-1000 MG tablet Take 1 tablet by mouth 2 (  two) times daily with a meal. 180 tablet 2  . diclofenac Sodium (VOLTAREN) 1 % GEL Apply 4 g topically 4 (four) times daily. 100 g 1   No facility-administered medications prior to visit.    No Known Allergies  ROS Review of Systems   Reviewed and all negative  Objective:    Physical Exam  There were no vitals taken for this visit. Wt Readings from Last 3 Encounters:  02/21/19 187 lb (84.8 kg)  12/05/18 180 lb (81.6 kg)  11/07/18 185 lb 3.2 oz (84 kg)     Health Maintenance Due  Topic Date Due  . COVID-19 Vaccine (1) Never done  . HIV Screening  Never done  . OPHTHALMOLOGY EXAM  02/02/2017  . FOOT EXAM  07/27/2017  . INFLUENZA VACCINE  Never done    There are no preventive care reminders to display for this patient.  Lab Results  Component Value Date   TSH  3.780 01/27/2018   Lab Results  Component Value Date   WBC 3.6 (L) 12/05/2018   HGB 12.0 (L) 12/05/2018   HCT 37.4 (L) 12/05/2018   MCV 82.0 12/05/2018   PLT 213 12/05/2018   Lab Results  Component Value Date   NA 131 (L) 12/05/2018   K 3.9 12/05/2018   CO2 23 12/05/2018   GLUCOSE 195 (H) 12/05/2018   BUN 19 12/05/2018   CREATININE 1.03 12/05/2018   BILITOT 0.5 12/05/2018   ALKPHOS 58 04/13/2019   AST 19 04/13/2019   ALT 19 04/13/2019   PROT 6.9 12/05/2018   ALBUMIN 3.7 12/05/2018   CALCIUM 8.3 (L) 12/05/2018   ANIONGAP 12 12/05/2018   Lab Results  Component Value Date   CHOL 119 04/13/2019   Lab Results  Component Value Date   HDL 37 (L) 04/13/2019   Lab Results  Component Value Date   LDLCALC 59 04/13/2019   Lab Results  Component Value Date   TRIG 131 04/13/2019   Lab Results  Component Value Date   CHOLHDL 5.1 (H) 11/07/2018   Lab Results  Component Value Date   HGBA1C 11.0 (H) 11/03/2019      Assessment & Plan:   Problem List Items Addressed This Visit      Cardiovascular and Mediastinum   Essential hypertension (Chronic)     Endocrine   Uncontrolled type 2 diabetes mellitus with hyperglycemia (HCC) - Primary   Relevant Medications   canagliflozin (INVOKANA) 100 MG TABS tablet     Other   Hyperlipidemia (Chronic)      Meds ordered this encounter  Medications  . canagliflozin (INVOKANA) 100 MG TABS tablet    Sig: Take 1 tablet (100 mg total) by mouth daily before breakfast.    Dispense:  30 tablet    Refill:  2   DM- A1c 11, he is reluctant to use insulin. Add Invokana, asked him to do Aqu-check 3 x aday and keep a log for f/u visit in 2 weeks.  Needs script for GE100, strips and lancets 3 month supply  HTN- Pt reports good control oncurrent medication  Hyperlipidemia- controled on Mevacor  LABS: CMP now             A1C in 3 months  Follow-up:  2 weeks   Kirt Boys, MD

## 2019-11-12 ENCOUNTER — Other Ambulatory Visit: Payer: Self-pay | Admitting: Internal Medicine

## 2019-11-13 LAB — COMPREHENSIVE METABOLIC PANEL
ALT: 14 IU/L (ref 0–44)
AST: 16 IU/L (ref 0–40)
Albumin/Globulin Ratio: 1.9 (ref 1.2–2.2)
Albumin: 4.6 g/dL (ref 3.8–4.9)
Alkaline Phosphatase: 57 IU/L (ref 44–121)
BUN/Creatinine Ratio: 14 (ref 9–20)
BUN: 14 mg/dL (ref 6–24)
Bilirubin Total: 0.2 mg/dL (ref 0.0–1.2)
CO2: 23 mmol/L (ref 20–29)
Calcium: 9.2 mg/dL (ref 8.7–10.2)
Chloride: 104 mmol/L (ref 96–106)
Creatinine, Ser: 1.03 mg/dL (ref 0.76–1.27)
GFR calc Af Amer: 93 mL/min/{1.73_m2} (ref >59)
GFR calc non Af Amer: 80 mL/min/{1.73_m2} (ref >59)
Globulin, Total: 2.4 g/dL (ref 1.5–4.5)
Glucose: 87 mg/dL (ref 65–99)
Potassium: 4.5 mmol/L (ref 3.5–5.2)
Sodium: 141 mmol/L (ref 134–144)
Total Protein: 7 g/dL (ref 6.0–8.5)

## 2019-11-21 ENCOUNTER — Ambulatory Visit: Payer: Self-pay | Admitting: Cardiovascular Disease

## 2019-11-21 ENCOUNTER — Ambulatory Visit: Payer: Self-pay

## 2019-11-21 ENCOUNTER — Other Ambulatory Visit: Payer: Self-pay

## 2019-11-21 NOTE — Progress Notes (Signed)
Breakfast: wakes up at 7:30-8 am and then breakfast around 8:30 am, brown bread with butter, 1 egg, tea with 1% milk and 1 splenda  Lunch: 12 pm green beans and piece of bread, eggs, salad, and sometimes soup, drinks water   Dinner: 7:30 -8 pm, broast chicken/meat/soup, piece of brown bread, and salad, drinks water  Snacks : 10 am 1 piece of fruit such as apple, orange, or banana with water, 5 pm eat fruit and water/tea  Physical Activity: no  Medications: takes after breakfast, lunch, and dinner  Started to work again in October so no time for physical activity.  Add protein with snacks and breakfast. Start to walk again for 2-3 times per week. Pt hemoglobin A1C remains very high. Reviewed why this important to get under control.  F/u: 3 months, lab check for HgA1C before next appt

## 2019-11-21 NOTE — Progress Notes (Deleted)
Breakfast: wakes up at 7:30-8 am and then breakfast around 8:30 am, brown bread with butter, 1 egg, tea with 1% milk and 1 splenda °  °Lunch: 12 pm green beans and piece of bread, eggs, salad, and sometimes soup, drinks water  °  °Dinner: 7:30 -8 pm, broast chicken/meat/soup, piece of brown bread, and salad, drinks water °  °Snacks : 10 am 1 piece of fruit such as apple, orange, or banana with water, 5 pm eat fruit and water/tea °  °Physical Activity: no °  °Medications: takes after breakfast, lunch, and dinner °  °Started to work again in October so no time for physical activity.  Add protein with snacks and breakfast. Start to walk again for 2-3 times per week. Pt hemoglobin A1C remains very high. Reviewed why this important to get under control. °  °F/u: 3 months, lab check for HgA1C before next appt °

## 2020-02-02 ENCOUNTER — Other Ambulatory Visit: Payer: Self-pay | Admitting: Internal Medicine

## 2020-02-03 LAB — HGB A1C W/O EAG: Hgb A1c MFr Bld: 7.4 % — ABNORMAL HIGH (ref 4.8–5.6)

## 2020-02-06 ENCOUNTER — Other Ambulatory Visit: Payer: Self-pay

## 2020-02-06 ENCOUNTER — Ambulatory Visit: Payer: Self-pay | Admitting: Internal Medicine

## 2020-02-06 ENCOUNTER — Other Ambulatory Visit: Payer: Self-pay | Admitting: Internal Medicine

## 2020-02-06 ENCOUNTER — Encounter: Payer: Self-pay | Admitting: Internal Medicine

## 2020-02-06 ENCOUNTER — Ambulatory Visit: Payer: Self-pay

## 2020-02-06 VITALS — BP 137/87 | HR 79 | Temp 98.6°F | Ht 69.29 in | Wt 177.0 lb

## 2020-02-06 DIAGNOSIS — E119 Type 2 diabetes mellitus without complications: Secondary | ICD-10-CM

## 2020-02-06 MED ORDER — GLIMEPIRIDE 4 MG PO TABS: 4.0000 mg | ORAL_TABLET | Freq: Two times a day (BID) | ORAL | 2 refills | Status: DC

## 2020-02-06 MED ORDER — SITAGLIPTIN PHOS-METFORMIN HCL 50-1000 MG PO TABS: 1.0000 | ORAL_TABLET | Freq: Two times a day (BID) | ORAL | 2 refills | Status: DC

## 2020-02-06 NOTE — Progress Notes (Signed)
Wake: 7:30 am  Breakfast: 8 am, cup of tea with milk and 2 biscuit (zero sugar)  Lunch: 11-12 pm green beans and salad (tomatoes, onions, lemon) and 1/2 brown bread like pita   Dinner: varies 7-9 pm piece of chicken or meat, soup, and salad, 1/2 brown bread, eat orange/apple  Snack: 5 pm 2 fried eggs, 1 piece of brown bread, and sometimes cheese  Snack: 2 pm pear or orange  Drinks: water  Physical Activity: walk/run 3 times 15-20 minutes  Medications: same time and everyday  Goals: 1) change cooking method for eggs, boiled instead of fried 2) choose reduced sodium and fat cheese over regular 3) reviewed healthy breakfast options such as oatmeal, eggs and toast, or toast with peanut butter/hummus/cheese 4) healthier fruit choices, choose berries and melons over others  Follow up: 3 months, check HgA1C prior to next appt

## 2020-02-06 NOTE — Progress Notes (Signed)
Established Patient Office Visit  Subjective:  Patient ID: Todd Barnes, male    DOB: 09-05-1960  Age: 60 y.o. MRN: [id number]  CC:  Chief Complaint  Patient presents with  . Annual Exam    HPI [facility] [name] presents for follow up. Interval improvement in diabetic control with normal CMP. Some hypoglycemic readings on review of home bg readings.  Past Medical History:  Diagnosis Date  . Diabetes mellitus without complication (HCC)   . Hyperlipidemia   . Hypertension     History reviewed. No pertinent surgical history.  Family History  Problem Relation Age of Onset  . Diabetes Mother     Social History   Socioeconomic History  . Marital status: Married    Spouse name: Not on file  . Number of children: Not on file  . Years of education: 66  . Highest education level: Not on file  Occupational History  . Occupation: house keeping  Tobacco Use  . Smoking status: Never Smoker  . Smokeless tobacco: Never Used  Vaping Use  . Vaping Use: Never used  Substance and Sexual Activity  . Alcohol use: No  . Drug use: No  . Sexual activity: Never  Other Topics Concern  . Not on file  Social History Narrative  . Not on file   Social Determinants of Health   Financial Resource Strain: Not on file  Food Insecurity: Not on file  Transportation Needs: Not on file  Physical Activity: Not on file  Stress: Not on file  Social Connections: Not on file  Intimate Partner Violence: Not on file    Outpatient Medications Prior to Visit  Medication Sig Dispense Refill  . acetaminophen (TYLENOL) 500 MG tablet Take 1 tablet (500 mg total) by mouth every 6 (six) hours as needed for mild pain, moderate pain, fever or headache. 90 tablet 1  . aspirin 81 MG chewable tablet Chew 1 tablet (81 mg total) by mouth daily. 90 tablet 3  . atenolol (TENORMIN) 50 MG tablet Take 1 tablet (50 mg total) by mouth daily. 90 tablet 3  . fenofibrate (TRICOR) 145 MG tablet Take  1 tablet (145 mg total) by mouth daily. 90 tablet 2  . glimepiride (AMARYL) 4 MG tablet Take 1 tablet (4 mg total) by mouth 2 (two) times daily. 180 tablet 2  . lisinopril-hydrochlorothiazide (ZESTORETIC) 20-12.5 MG tablet 1 tab po bid 180 tablet 3  . lovastatin (MEVACOR) 20 MG tablet Take 1 tablet (20 mg total) by mouth at bedtime. 90 tablet 3  . sitaGLIPtin-metformin (JANUMET) 50-1000 MG tablet Take 1 tablet by mouth 2 (two) times daily with a meal. 180 tablet 2   No facility-administered medications prior to visit.    No Known Allergies  ROS Review of Systems    Objective:    Physical Exam  BP 137/87 (BP Location: Left Arm, Patient Position: Sitting, Cuff Size: Normal)   Pulse 79   Temp 98.6 F (37 C) (Oral)   Ht 5' 9.29" (1.76 m)   Wt 177 lb (80.3 kg)   SpO2 98%   BMI 25.92 kg/m  Wt Readings from Last 3 Encounters:  02/06/20 177 lb (80.3 kg)  02/21/19 187 lb (84.8 kg)  12/05/18 180 lb (81.6 kg)     Health Maintenance Due  Topic Date Due  . COVID-19 Vaccine (1) Never done  . HIV Screening  Never done  . OPHTHALMOLOGY EXAM  02/02/2017  . FOOT EXAM  07/27/2017  . INFLUENZA  VACCINE  Never done    There are no preventive care reminders to display for this patient.  Lab Results  Component Value Date   TSH 3.780 01/27/2018   Lab Results  Component Value Date   WBC 3.6 (L) 12/05/2018   HGB 12.0 (L) 12/05/2018   HCT 37.4 (L) 12/05/2018   MCV 82.0 12/05/2018   PLT 213 12/05/2018   Lab Results  Component Value Date   NA 141 11/12/2019   K 4.5 11/12/2019   CO2 23 11/12/2019   GLUCOSE 87 11/12/2019   BUN 14 11/12/2019   CREATININE 1.03 11/12/2019   BILITOT 0.2 11/12/2019   ALKPHOS 57 11/12/2019   AST 16 11/12/2019   ALT 14 11/12/2019   PROT 7.0 11/12/2019   ALBUMIN 4.6 11/12/2019   CALCIUM 9.2 11/12/2019   ANIONGAP 12 12/05/2018   Lab Results  Component Value Date   CHOL 119 04/13/2019   Lab Results  Component Value Date   HDL 37 (L) 04/13/2019    Lab Results  Component Value Date   LDLCALC 59 04/13/2019   Lab Results  Component Value Date   TRIG 131 04/13/2019   Lab Results  Component Value Date   CHOLHDL 5.1 (H) 11/07/2018   Lab Results  Component Value Date   HGBA1C 7.4 (H) 02/02/2020      Assessment & Plan:   Problem List Items Addressed This Visit   None    Doing well, continue current medications. Advised to eat regularly to avoid hypoglycemia No orders of the defined types were placed in this encounter.   Follow-up: No follow-ups on file.    Luna Fuse, MD

## 2020-02-20 ENCOUNTER — Ambulatory Visit: Payer: Self-pay | Admitting: Internal Medicine

## 2020-02-29 ENCOUNTER — Telehealth: Payer: Self-pay | Admitting: Pharmacy Technician

## 2020-02-29 NOTE — Telephone Encounter (Signed)
Pt has prescription drug coverage with Medicaid-[employer].  No longer meets [facility]'s eligibility requirements.  Pt notified by letter.  Sherilyn Dacosta Care Manager [facility]   [address] [place], [place]  [address]  February 29, 2020    Cheree Ditto [address] [place], [place]  [address]  Dear Leticia Clas:  This is to inform you that you are no longer eligible to receive medication assistance at [facility].  The reason(s) are:    _____Your total gross monthly household income exceeds 250% of the Federal Poverty Level.   _____Tangible assets (savings, checking, stocks/bonds, pension, retirement, etc.) exceeds our limit  __X__You are eligible to receive benefits from [facility], [facility] or HIV Medication            Assistance program _____You are eligible to receive benefits from a Medicare Part "D" plan __X__You have prescription insurance with [employer]  _____You are not an [employer] resident _____Failure to provide all requested proof of income information for 2022.    We regret that we are unable to help you at this time.  If your prescription coverage is terminated, please contact [facility], so that we may reassess your eligibility for our program.  If you have questions, we may be contacted at [phone].  Thank you,  [facility]

## 2020-04-04 ENCOUNTER — Other Ambulatory Visit: Payer: Self-pay

## 2020-04-13 ENCOUNTER — Other Ambulatory Visit: Payer: Self-pay

## 2020-04-13 DIAGNOSIS — E785 Hyperlipidemia, unspecified: Secondary | ICD-10-CM

## 2020-04-13 DIAGNOSIS — E119 Type 2 diabetes mellitus without complications: Secondary | ICD-10-CM

## 2020-04-13 MED ORDER — GLIMEPIRIDE 4 MG PO TABS
4.0000 mg | ORAL_TABLET | Freq: Two times a day (BID) | ORAL | 3 refills | Status: DC
  Filled 2020-04-13: qty 180, 90d supply, fill #0

## 2020-04-13 MED ORDER — LOVASTATIN 20 MG PO TABS
20.0000 mg | ORAL_TABLET | Freq: Every day | ORAL | 3 refills | Status: DC
  Filled 2020-04-13: qty 90, 90d supply, fill #0

## 2020-05-07 ENCOUNTER — Ambulatory Visit: Payer: Self-pay | Admitting: Internal Medicine

## 2020-05-07 ENCOUNTER — Ambulatory Visit: Payer: Self-pay

## 2020-05-16 ENCOUNTER — Other Ambulatory Visit: Payer: Self-pay | Admitting: Internal Medicine

## 2020-05-17 LAB — LIPID PANEL W/O CHOL/HDL RATIO
Cholesterol, Total: 126 mg/dL (ref 100–199)
HDL: 39 mg/dL — ABNORMAL LOW (ref >39)
LDL Chol Calc (NIH): 69 mg/dL (ref 0–99)
Triglycerides: 93 mg/dL (ref 0–149)
VLDL Cholesterol Cal: 18 mg/dL (ref 5–40)

## 2020-05-17 LAB — URINALYSIS, ROUTINE W REFLEX MICROSCOPIC
Bilirubin, UA: NEGATIVE
Glucose, UA: NEGATIVE
Ketones, UA: NEGATIVE
Leukocytes,UA: NEGATIVE
Nitrite, UA: NEGATIVE
Protein,UA: NEGATIVE
RBC, UA: NEGATIVE
Specific Gravity, UA: 1.014 (ref 1.005–1.030)
Urobilinogen, Ur: 0.2 mg/dL (ref 0.2–1.0)
pH, UA: 6 (ref 5.0–7.5)

## 2020-05-17 LAB — MICROALBUMIN / CREATININE URINE RATIO
Creatinine, Urine: 79.2 mg/dL
Microalb/Creat Ratio: <4 mg/g creat (ref 0–29)
Microalbumin, Urine: <3 ug/mL

## 2020-05-17 LAB — HGB A1C W/O EAG: Hgb A1c MFr Bld: 8.5 % — ABNORMAL HIGH (ref 4.8–5.6)

## 2020-05-21 ENCOUNTER — Ambulatory Visit: Payer: Self-pay

## 2020-05-21 ENCOUNTER — Ambulatory Visit: Payer: Self-pay | Admitting: Internal Medicine

## 2020-05-24 ENCOUNTER — Other Ambulatory Visit: Payer: Self-pay

## 2020-05-26 ENCOUNTER — Other Ambulatory Visit: Payer: Self-pay

## 2020-06-04 ENCOUNTER — Ambulatory Visit: Payer: Self-pay | Admitting: Cardiovascular Disease

## 2020-06-18 ENCOUNTER — Ambulatory Visit: Payer: Self-pay

## 2020-06-18 ENCOUNTER — Other Ambulatory Visit: Payer: Self-pay

## 2020-06-18 DIAGNOSIS — E785 Hyperlipidemia, unspecified: Secondary | ICD-10-CM

## 2020-06-18 DIAGNOSIS — E781 Pure hyperglyceridemia: Secondary | ICD-10-CM

## 2020-06-18 DIAGNOSIS — E119 Type 2 diabetes mellitus without complications: Secondary | ICD-10-CM

## 2020-06-18 MED ORDER — ASPIRIN 81 MG PO CHEW: CHEWABLE_TABLET | ORAL | 3 refills | Status: AC

## 2020-06-18 MED ORDER — GLIMEPIRIDE 4 MG PO TABS: 4.0000 mg | ORAL_TABLET | Freq: Two times a day (BID) | ORAL | 3 refills | Status: DC

## 2020-06-18 MED ORDER — LISINOPRIL 20 MG PO TABS: ORAL_TABLET | Freq: Two times a day (BID) | ORAL | 3 refills | Status: DC

## 2020-06-18 MED ORDER — HYDROCHLOROTHIAZIDE 25 MG PO TABS: ORAL_TABLET | ORAL | 3 refills | Status: DC

## 2020-06-18 MED ORDER — LOVASTATIN 20 MG PO TABS: 20.0000 mg | ORAL_TABLET | Freq: Every day | ORAL | 3 refills | Status: AC

## 2020-06-18 MED ORDER — ATENOLOL 50 MG PO TABS: ORAL_TABLET | Freq: Every day | ORAL | 3 refills | Status: DC

## 2020-06-18 MED ORDER — FENOFIBRATE 145 MG PO TABS: ORAL_TABLET | Freq: Every day | ORAL | 2 refills | Status: DC

## 2020-06-18 NOTE — Progress Notes (Signed)
Breakfast: Wakes up at 7:30-8:00 am. Eats at 8:30 am. Half a slice of Brown bread with butter, 1 fried egg, and salad (tomato, lettuce, onion, olives, and olive oil and lemon with salt). Sometimes has tea in the morning. If not will drink water with this meal  Lunch: Eats at 2 pm. 1 slice of brown bread, soup (beans or chicken or meat) with salad, fruit (1 orange or apple). Water with this meal.  Dinner: Eats dinner at 8 pm. Will have beans and salad with vegetable soup/chicken/meat. Will end the meal with fruit (1 orange or apple)   Snacks: 11:30 -12:00 pm 3 whole wheat biscuits and cup of tea (milk and splenda)  Fluid Intake: water (3-4 cups) and tea (1-2 cups)  PA: Running for 17- 20 minutes 3-4 times a week.  Medications: Consistently takes medications at the same time daily (morning and afternoon)  Goals: Practice breathing exercises and meditating. Encouraged patient to measure out intake of beans at meals a few times to get a sense of how much he is consuming. Set goal of 1/4 to 1/2 cup of beans and to replace that difference with more salad/soup/meat/chicken.  Follow up: 3 months. Encouraged to get HgA1C checked prior to next appt.

## 2020-06-18 NOTE — Addendum Note (Signed)
Addended by: Mordecai Rasmussen on: 06/18/2020 11:20 AM   Modules accepted: Orders

## 2020-07-13 ENCOUNTER — Emergency Department (HOSPITAL_COMMUNITY): Payer: Medicaid Other | Admitting: Certified Registered Nurse Anesthetist

## 2020-07-13 ENCOUNTER — Encounter (HOSPITAL_COMMUNITY): Admission: EM | Disposition: A | Discharge status: Home / Self Care | Payer: Self-pay | Source: Home / Self Care

## 2020-07-13 ENCOUNTER — Other Ambulatory Visit: Payer: Self-pay

## 2020-07-13 ENCOUNTER — Inpatient Hospital Stay (HOSPITAL_COMMUNITY)
Admission: EM | Admit: 2020-07-13 | Discharge: 2020-07-15 | DRG: 339 | Disposition: A | Discharge status: Home / Self Care | Payer: Medicaid Other | Attending: General Surgery | Admitting: General Surgery

## 2020-07-13 ENCOUNTER — Emergency Department (HOSPITAL_COMMUNITY): Payer: Medicaid Other

## 2020-07-13 ENCOUNTER — Encounter (HOSPITAL_COMMUNITY): Payer: Self-pay

## 2020-07-13 DIAGNOSIS — N400 Enlarged prostate without lower urinary tract symptoms: Secondary | ICD-10-CM | POA: Diagnosis present

## 2020-07-13 DIAGNOSIS — E785 Hyperlipidemia, unspecified: Secondary | ICD-10-CM | POA: Diagnosis present

## 2020-07-13 DIAGNOSIS — K5641 Fecal impaction: Secondary | ICD-10-CM | POA: Diagnosis present

## 2020-07-13 DIAGNOSIS — I10 Essential (primary) hypertension: Secondary | ICD-10-CM | POA: Diagnosis present

## 2020-07-13 DIAGNOSIS — E1152 Type 2 diabetes mellitus with diabetic peripheral angiopathy with gangrene: Secondary | ICD-10-CM | POA: Diagnosis present

## 2020-07-13 DIAGNOSIS — K3532 Acute appendicitis with perforation and localized peritonitis, without abscess: Principal | ICD-10-CM | POA: Diagnosis present

## 2020-07-13 DIAGNOSIS — Z20822 Contact with and (suspected) exposure to covid-19: Secondary | ICD-10-CM | POA: Diagnosis present

## 2020-07-13 DIAGNOSIS — Z833 Family history of diabetes mellitus: Secondary | ICD-10-CM | POA: Diagnosis not present

## 2020-07-13 DIAGNOSIS — K3580 Unspecified acute appendicitis: Secondary | ICD-10-CM | POA: Diagnosis present

## 2020-07-13 DIAGNOSIS — K760 Fatty (change of) liver, not elsewhere classified: Secondary | ICD-10-CM | POA: Diagnosis present

## 2020-07-13 DIAGNOSIS — Z9049 Acquired absence of other specified parts of digestive tract: Secondary | ICD-10-CM

## 2020-07-13 HISTORY — PX: LAPAROSCOPIC APPENDECTOMY: SHX408

## 2020-07-13 LAB — RESP PANEL BY RT-PCR (FLU A&B, COVID) ARPGX2
Influenza A by PCR: NEGATIVE
Influenza B by PCR: NEGATIVE
SARS Coronavirus 2 by RT PCR: NEGATIVE

## 2020-07-13 LAB — CBC WITH DIFFERENTIAL/PLATELET
Abs Immature Granulocytes: 0.06 10*3/uL (ref 0.00–0.07)
Basophils Absolute: 0 10*3/uL (ref 0.0–0.1)
Basophils Relative: 0 %
Eosinophils Absolute: 0 10*3/uL (ref 0.0–0.5)
Eosinophils Relative: 0 %
HCT: 41.3 % (ref 39.0–52.0)
Hemoglobin: 13.5 g/dL (ref 13.0–17.0)
Immature Granulocytes: 1 %
Lymphocytes Relative: 11 %
Lymphs Abs: 1.3 10*3/uL (ref 0.7–4.0)
MCH: 26.6 pg (ref 26.0–34.0)
MCHC: 32.7 g/dL (ref 30.0–36.0)
MCV: 81.5 fL (ref 80.0–100.0)
Monocytes Absolute: 0.9 10*3/uL (ref 0.1–1.0)
Monocytes Relative: 7 %
Neutro Abs: 9.4 10*3/uL — ABNORMAL HIGH (ref 1.7–7.7)
Neutrophils Relative %: 81 %
Platelets: 327 10*3/uL (ref 150–400)
RBC: 5.07 MIL/uL (ref 4.22–5.81)
RDW: 12.7 % (ref 11.5–15.5)
WBC: 11.6 10*3/uL — ABNORMAL HIGH (ref 4.0–10.5)
nRBC: 0 % (ref 0.0–0.2)

## 2020-07-13 LAB — TYPE AND SCREEN
ABO/RH(D): B POS
Antibody Screen: NEGATIVE

## 2020-07-13 LAB — COMPREHENSIVE METABOLIC PANEL
ALT: 16 U/L (ref 0–44)
AST: 17 U/L (ref 15–41)
Albumin: 4.1 g/dL (ref 3.5–5.0)
Alkaline Phosphatase: 39 U/L (ref 38–126)
Anion gap: 13 (ref 5–15)
BUN: 18 mg/dL (ref 6–20)
CO2: 26 mmol/L (ref 22–32)
Calcium: 9.3 mg/dL (ref 8.9–10.3)
Chloride: 92 mmol/L — ABNORMAL LOW (ref 98–111)
Creatinine, Ser: 1.17 mg/dL (ref 0.61–1.24)
GFR, Estimated: >60 mL/min (ref >60)
Glucose, Bld: 221 mg/dL — ABNORMAL HIGH (ref 70–99)
Potassium: 4.3 mmol/L (ref 3.5–5.1)
Sodium: 131 mmol/L — ABNORMAL LOW (ref 135–145)
Total Bilirubin: 0.7 mg/dL (ref 0.3–1.2)
Total Protein: 8.3 g/dL — ABNORMAL HIGH (ref 6.5–8.1)

## 2020-07-13 LAB — LACTIC ACID, PLASMA
Lactic Acid, Venous: 2.4 mmol/L (ref 0.5–1.9)
Lactic Acid, Venous: 1.8 mmol/L (ref 0.5–1.9)

## 2020-07-13 LAB — LIPASE, BLOOD: Lipase: 340 U/L — ABNORMAL HIGH (ref 11–51)

## 2020-07-13 LAB — GLUCOSE, CAPILLARY
Glucose-Capillary: 198 mg/dL — ABNORMAL HIGH (ref 70–99)
Glucose-Capillary: 221 mg/dL — ABNORMAL HIGH (ref 70–99)

## 2020-07-13 SURGERY — APPENDECTOMY, LAPAROSCOPIC
Anesthesia: General | Site: Abdomen

## 2020-07-13 MED ORDER — INSULIN ASPART 100 UNIT/ML IJ SOLN
0.0000 [IU] | Freq: Every day | INTRAMUSCULAR | Status: DC
  Administered 2020-07-14: 2 [IU] via SUBCUTANEOUS

## 2020-07-13 MED ORDER — LIDOCAINE HCL (CARDIAC) PF 100 MG/5ML IV SOSY
PREFILLED_SYRINGE | INTRAVENOUS | Status: DC | PRN
  Administered 2020-07-13: 60 mg via INTRAVENOUS

## 2020-07-13 MED ORDER — HEPARIN SODIUM (PORCINE) 5000 UNIT/ML IJ SOLN
5000.0000 [IU] | Freq: Three times a day (TID) | INTRAMUSCULAR | Status: DC
  Administered 2020-07-14 – 2020-07-15 (×4): 5000 [IU] via SUBCUTANEOUS
  Filled 2020-07-13 (×4): qty 1

## 2020-07-13 MED ORDER — ACETAMINOPHEN 10 MG/ML IV SOLN: 1000.0000 mg | Freq: Once | INTRAVENOUS | Status: DC | PRN

## 2020-07-13 MED ORDER — FENTANYL CITRATE (PF) 100 MCG/2ML IJ SOLN
INTRAMUSCULAR | Status: DC | PRN
  Administered 2020-07-13: 150 ug via INTRAVENOUS

## 2020-07-13 MED ORDER — OXYCODONE HCL 5 MG/5ML PO SOLN: 5.0000 mg | Freq: Once | ORAL | Status: DC | PRN

## 2020-07-13 MED ORDER — SIMETHICONE 80 MG PO CHEW: 40.0000 mg | CHEWABLE_TABLET | Freq: Four times a day (QID) | ORAL | Status: DC | PRN

## 2020-07-13 MED ORDER — SODIUM CHLORIDE 0.9 % IV SOLN
2.0000 g | Freq: Once | INTRAVENOUS | Status: AC
  Administered 2020-07-13: 2 g via INTRAVENOUS
  Filled 2020-07-13: qty 20

## 2020-07-13 MED ORDER — DIPHENHYDRAMINE HCL 12.5 MG/5ML PO ELIX: 12.5000 mg | ORAL_SOLUTION | Freq: Four times a day (QID) | ORAL | Status: DC | PRN

## 2020-07-13 MED ORDER — ACETAMINOPHEN 160 MG/5ML PO SOLN: 1000.0000 mg | Freq: Once | ORAL | Status: DC | PRN

## 2020-07-13 MED ORDER — ACETAMINOPHEN 500 MG PO TABS: 1000.0000 mg | ORAL_TABLET | Freq: Once | ORAL | Status: DC | PRN

## 2020-07-13 MED ORDER — ACETAMINOPHEN 325 MG PO TABS
650.0000 mg | ORAL_TABLET | Freq: Once | ORAL | Status: AC
  Administered 2020-07-13: 650 mg via ORAL
  Filled 2020-07-13: qty 2

## 2020-07-13 MED ORDER — OXYCODONE HCL 5 MG PO TABS: 5.0000 mg | ORAL_TABLET | Freq: Once | ORAL | Status: DC | PRN

## 2020-07-13 MED ORDER — PIPERACILLIN-TAZOBACTAM 3.375 G IVPB
3.3750 g | Freq: Three times a day (TID) | INTRAVENOUS | Status: DC
  Administered 2020-07-14 – 2020-07-15 (×4): 3.375 g via INTRAVENOUS
  Filled 2020-07-13 (×5): qty 50

## 2020-07-13 MED ORDER — FENOFIBRATE 160 MG PO TABS
160.0000 mg | ORAL_TABLET | Freq: Every day | ORAL | Status: DC
  Administered 2020-07-14 – 2020-07-15 (×2): 160 mg via ORAL
  Filled 2020-07-13 (×2): qty 1

## 2020-07-13 MED ORDER — DIPHENHYDRAMINE HCL 50 MG/ML IJ SOLN: 12.5000 mg | Freq: Four times a day (QID) | INTRAMUSCULAR | Status: DC | PRN

## 2020-07-13 MED ORDER — HYDRALAZINE HCL 20 MG/ML IJ SOLN: 10.0000 mg | INTRAMUSCULAR | Status: DC | PRN

## 2020-07-13 MED ORDER — BUPIVACAINE-EPINEPHRINE (PF) 0.25% -1:200000 IJ SOLN
INTRAMUSCULAR | Status: AC
  Filled 2020-07-13: qty 30

## 2020-07-13 MED ORDER — ACETAMINOPHEN 500 MG PO TABS
1000.0000 mg | ORAL_TABLET | Freq: Four times a day (QID) | ORAL | Status: DC
  Administered 2020-07-14 – 2020-07-15 (×5): 1000 mg via ORAL
  Filled 2020-07-13 (×6): qty 2

## 2020-07-13 MED ORDER — ONDANSETRON HCL 4 MG/2ML IJ SOLN
INTRAMUSCULAR | Status: AC
  Filled 2020-07-13: qty 2

## 2020-07-13 MED ORDER — ONDANSETRON HCL 4 MG/2ML IJ SOLN
INTRAMUSCULAR | Status: DC | PRN
  Administered 2020-07-13: 4 mg via INTRAVENOUS

## 2020-07-13 MED ORDER — PIPERACILLIN-TAZOBACTAM 3.375 G IVPB 30 MIN
3.3750 g | INTRAVENOUS | Status: AC
  Administered 2020-07-14: 3.375 g via INTRAVENOUS
  Filled 2020-07-13 (×2): qty 50

## 2020-07-13 MED ORDER — LISINOPRIL-HYDROCHLOROTHIAZIDE 20-12.5 MG PO TABS: 1.0000 | ORAL_TABLET | Freq: Two times a day (BID) | ORAL | Status: DC

## 2020-07-13 MED ORDER — DEXAMETHASONE SODIUM PHOSPHATE 4 MG/ML IJ SOLN
INTRAMUSCULAR | Status: DC | PRN
  Administered 2020-07-13: 4 mg via INTRAVENOUS

## 2020-07-13 MED ORDER — FENTANYL CITRATE (PF) 100 MCG/2ML IJ SOLN: 25.0000 ug | INTRAMUSCULAR | Status: DC | PRN

## 2020-07-13 MED ORDER — METRONIDAZOLE 500 MG/100ML IV SOLN
500.0000 mg | Freq: Once | INTRAVENOUS | Status: AC
  Administered 2020-07-13: 500 mg via INTRAVENOUS
  Filled 2020-07-13: qty 100

## 2020-07-13 MED ORDER — EPHEDRINE SULFATE 50 MG/ML IJ SOLN: INTRAMUSCULAR | Status: DC | PRN

## 2020-07-13 MED ORDER — HYDROMORPHONE HCL 1 MG/ML IJ SOLN: 0.5000 mg | INTRAMUSCULAR | Status: DC | PRN

## 2020-07-13 MED ORDER — SODIUM CHLORIDE 0.9 % IV BOLUS
1000.0000 mL | Freq: Once | INTRAVENOUS | Status: AC
  Administered 2020-07-13: 1000 mL via INTRAVENOUS

## 2020-07-13 MED ORDER — SODIUM CHLORIDE 0.9 % IR SOLN
Status: DC | PRN
  Administered 2020-07-13: 1000 mL

## 2020-07-13 MED ORDER — LACTATED RINGERS IV SOLN: INTRAVENOUS | Status: DC | PRN

## 2020-07-13 MED ORDER — PHENYLEPHRINE HCL-NACL 10-0.9 MG/250ML-% IV SOLN
INTRAVENOUS | Status: DC | PRN
  Administered 2020-07-13: 50 ug/min via INTRAVENOUS

## 2020-07-13 MED ORDER — LACTATED RINGERS IV SOLN: INTRAVENOUS | Status: DC

## 2020-07-13 MED ORDER — MIDAZOLAM HCL 2 MG/2ML IJ SOLN
INTRAMUSCULAR | Status: AC
  Filled 2020-07-13: qty 2

## 2020-07-13 MED ORDER — ONDANSETRON HCL 4 MG/2ML IJ SOLN: 4.0000 mg | Freq: Four times a day (QID) | INTRAMUSCULAR | Status: DC | PRN

## 2020-07-13 MED ORDER — MIDAZOLAM HCL 5 MG/5ML IJ SOLN
INTRAMUSCULAR | Status: DC | PRN
  Administered 2020-07-13: 2 mg via INTRAVENOUS

## 2020-07-13 MED ORDER — PROPOFOL 10 MG/ML IV BOLUS
INTRAVENOUS | Status: AC
  Filled 2020-07-13: qty 40

## 2020-07-13 MED ORDER — BUPIVACAINE-EPINEPHRINE 0.25% -1:200000 IJ SOLN
INTRAMUSCULAR | Status: DC | PRN
  Administered 2020-07-13: 10 mL

## 2020-07-13 MED ORDER — SUGAMMADEX SODIUM 200 MG/2ML IV SOLN
INTRAVENOUS | Status: DC | PRN
  Administered 2020-07-13: 200 mg via INTRAVENOUS

## 2020-07-13 MED ORDER — ATENOLOL 50 MG PO TABS
50.0000 mg | ORAL_TABLET | Freq: Every day | ORAL | Status: DC
  Administered 2020-07-14 – 2020-07-15 (×2): 50 mg via ORAL
  Filled 2020-07-13 (×2): qty 1

## 2020-07-13 MED ORDER — PROPOFOL 10 MG/ML IV BOLUS
INTRAVENOUS | Status: DC | PRN
  Administered 2020-07-13: 130 mg via INTRAVENOUS

## 2020-07-13 MED ORDER — INSULIN ASPART 100 UNIT/ML IJ SOLN
0.0000 [IU] | Freq: Three times a day (TID) | INTRAMUSCULAR | Status: DC
  Administered 2020-07-14: 8 [IU] via SUBCUTANEOUS
  Administered 2020-07-14: 3 [IU] via SUBCUTANEOUS
  Administered 2020-07-15: 5 [IU] via SUBCUTANEOUS
  Administered 2020-07-15: 3 [IU] via SUBCUTANEOUS

## 2020-07-13 MED ORDER — IBUPROFEN 200 MG PO TABS: 600.0000 mg | ORAL_TABLET | Freq: Four times a day (QID) | ORAL | Status: DC | PRN

## 2020-07-13 MED ORDER — DOCUSATE SODIUM 100 MG PO CAPS
200.0000 mg | ORAL_CAPSULE | Freq: Two times a day (BID) | ORAL | Status: DC
  Administered 2020-07-14 – 2020-07-15 (×3): 200 mg via ORAL
  Filled 2020-07-13 (×3): qty 2

## 2020-07-13 MED ORDER — ROCURONIUM BROMIDE 10 MG/ML (PF) SYRINGE
PREFILLED_SYRINGE | INTRAVENOUS | Status: DC | PRN
  Administered 2020-07-13: 60 mg via INTRAVENOUS

## 2020-07-13 MED ORDER — 0.9 % SODIUM CHLORIDE (POUR BTL) OPTIME
TOPICAL | Status: DC | PRN
  Administered 2020-07-13: 1000 mL

## 2020-07-13 MED ORDER — PRAVASTATIN SODIUM 40 MG PO TABS
20.0000 mg | ORAL_TABLET | Freq: Every day | ORAL | Status: DC
  Administered 2020-07-14: 20 mg via ORAL
  Filled 2020-07-13: qty 1

## 2020-07-13 MED ORDER — FENTANYL CITRATE (PF) 250 MCG/5ML IJ SOLN
INTRAMUSCULAR | Status: AC
  Filled 2020-07-13: qty 5

## 2020-07-13 MED ORDER — ASPIRIN 81 MG PO CHEW
81.0000 mg | CHEWABLE_TABLET | Freq: Every day | ORAL | Status: DC
  Administered 2020-07-14 – 2020-07-15 (×2): 81 mg via ORAL
  Filled 2020-07-13 (×2): qty 1

## 2020-07-13 MED ORDER — ONDANSETRON 4 MG PO TBDP: 4.0000 mg | ORAL_TABLET | Freq: Four times a day (QID) | ORAL | Status: DC | PRN

## 2020-07-13 MED ORDER — TRAMADOL HCL 50 MG PO TABS: 50.0000 mg | ORAL_TABLET | Freq: Four times a day (QID) | ORAL | Status: DC | PRN

## 2020-07-13 MED ORDER — DEXAMETHASONE SODIUM PHOSPHATE 10 MG/ML IJ SOLN
INTRAMUSCULAR | Status: AC
  Filled 2020-07-13: qty 1

## 2020-07-13 MED ORDER — PHENYLEPHRINE HCL (PRESSORS) 10 MG/ML IV SOLN
INTRAVENOUS | Status: DC | PRN
  Administered 2020-07-13: 100 ug via INTRAVENOUS

## 2020-07-13 SURGICAL SUPPLY — 45 items
APPLIER CLIP 5 13 M/L LIGAMAX5 (MISCELLANEOUS)
BAG COUNTER SPONGE SURGICOUNT (BAG) ×2 IMPLANT
BLADE CLIPPER SURG (BLADE) ×2 IMPLANT
CANISTER SUCT 3000ML PPV (MISCELLANEOUS) ×2 IMPLANT
CHLORAPREP W/TINT 26 (MISCELLANEOUS) ×2 IMPLANT
CLIP APPLIE 5 13 M/L LIGAMAX5 (MISCELLANEOUS) IMPLANT
COVER SURGICAL LIGHT HANDLE (MISCELLANEOUS) ×2 IMPLANT
CUTTER FLEX LINEAR 45M (STAPLE) ×2 IMPLANT
DERMABOND ADVANCED (GAUZE/BANDAGES/DRESSINGS) ×1
DERMABOND ADVANCED .7 DNX12 (GAUZE/BANDAGES/DRESSINGS) ×1 IMPLANT
ELECT REM PT RETURN 9FT ADLT (ELECTROSURGICAL) ×2
ELECTRODE REM PT RTRN 9FT ADLT (ELECTROSURGICAL) ×1 IMPLANT
GLOVE SURG ENC MOIS LTX SZ7.5 (GLOVE) ×2 IMPLANT
GLOVE SURG UNDER LTX SZ8 (GLOVE) ×2 IMPLANT
GOWN STRL REUS W/ TWL LRG LVL3 (GOWN DISPOSABLE) ×2 IMPLANT
GOWN STRL REUS W/ TWL XL LVL3 (GOWN DISPOSABLE) ×2 IMPLANT
GOWN STRL REUS W/TWL LRG LVL3 (GOWN DISPOSABLE) ×2
GOWN STRL REUS W/TWL XL LVL3 (GOWN DISPOSABLE) ×2
IRRIG SUCT STRYKERFLOW 2 WTIP (MISCELLANEOUS) ×2
IRRIGATION SUCT STRKRFLW 2 WTP (MISCELLANEOUS) ×1 IMPLANT
KIT BASIN OR (CUSTOM PROCEDURE TRAY) ×2 IMPLANT
KIT TURNOVER KIT B (KITS) ×2 IMPLANT
NS IRRIG 1000ML POUR BTL (IV SOLUTION) ×2 IMPLANT
PAD ARMBOARD 7.5X6 YLW CONV (MISCELLANEOUS) ×4 IMPLANT
PENCIL SMOKE EVACUATOR (MISCELLANEOUS) ×2 IMPLANT
POUCH SPECIMEN RETRIEVAL 10MM (ENDOMECHANICALS) ×2 IMPLANT
RELOAD 45 VASCULAR/THIN (ENDOMECHANICALS) IMPLANT
RELOAD STAPLE TA45 3.5 REG BLU (ENDOMECHANICALS) ×2 IMPLANT
SCISSORS LAP 5X35 DISP (ENDOMECHANICALS) ×2 IMPLANT
SET IRRIG TUBING LAPAROSCOPIC (IRRIGATION / IRRIGATOR) ×2 IMPLANT
SET TUBE SMOKE EVAC HIGH FLOW (TUBING) ×2 IMPLANT
SHEARS HARMONIC ACE PLUS 36CM (ENDOMECHANICALS) ×2 IMPLANT
SLEEVE ENDOPATH XCEL 5M (ENDOMECHANICALS) ×2 IMPLANT
SPECIMEN JAR SMALL (MISCELLANEOUS) ×2 IMPLANT
SUT MNCRL AB 4-0 PS2 18 (SUTURE) ×2 IMPLANT
SUT VICRYL 0 UR6 27IN ABS (SUTURE) ×2 IMPLANT
TOWEL GREEN STERILE (TOWEL DISPOSABLE) ×2 IMPLANT
TOWEL GREEN STERILE FF (TOWEL DISPOSABLE) ×2 IMPLANT
TRAY FOLEY W/BAG SLVR 16FR (SET/KITS/TRAYS/PACK) ×1
TRAY FOLEY W/BAG SLVR 16FR ST (SET/KITS/TRAYS/PACK) ×1 IMPLANT
TRAY LAPAROSCOPIC MC (CUSTOM PROCEDURE TRAY) ×2 IMPLANT
TROCAR XCEL BLUNT TIP 100MML (ENDOMECHANICALS) ×2 IMPLANT
TROCAR XCEL NON-BLD 5MMX100MML (ENDOMECHANICALS) ×2 IMPLANT
WARMER LAPAROSCOPE (MISCELLANEOUS) ×2 IMPLANT
WATER STERILE IRR 1000ML POUR (IV SOLUTION) ×2 IMPLANT

## 2020-07-13 NOTE — H&P (Signed)
CC: Right lower abdominal pain  Requesting provider: Dietrich Pates, PA-C  HPI: Klint M.E Reade Trefz is an 60 y.o. male with hx of HTN, HLD, DM who presents to the emergency department with a 2-day history of worsening right lower quadrant abdominal pain.  He has had associated nausea and vomiting.  This has been going on since his symptoms began.  He denies ever having had this kind of pain before.  The pain is sharp and does not radiate.  Nothing seems to make it better or worse.  He has not had anything to eat or drink in the last 8 hours.  He denies ever having had any abdominal surgical history.  Past Medical History:  Diagnosis Date   Diabetes mellitus without complication (HCC)    Hyperlipidemia    Hypertension     History reviewed. No pertinent surgical history.  Family History  Problem Relation Age of Onset   Diabetes Mother     Social:  reports that he has never smoked. He has never used smokeless tobacco. He reports that he does not drink alcohol and does not use drugs.  Allergies: No Known Allergies  Medications: I have reviewed the patient's current medications.  Results for orders placed or performed during the hospital encounter of 07/13/20 (from the past 48 hour(s))  Comprehensive metabolic panel     Status: Abnormal   Collection Time: 07/13/20  4:53 PM  Result Value Ref Range   Sodium 131 (L) 135 - 145 mmol/L   Potassium 4.3 3.5 - 5.1 mmol/L   Chloride 92 (L) 98 - 111 mmol/L   CO2 26 22 - 32 mmol/L   Glucose, Bld 221 (H) 70 - 99 mg/dL    Comment: Glucose reference range applies only to samples taken after fasting for at least 8 hours.   BUN 18 6 - 20 mg/dL   Creatinine, Ser 2.40 0.61 - 1.24 mg/dL   Calcium 9.3 8.9 - 97.3 mg/dL   Total Protein 8.3 (H) 6.5 - 8.1 g/dL   Albumin 4.1 3.5 - 5.0 g/dL   AST 17 15 - 41 U/L   ALT 16 0 - 44 U/L   Alkaline Phosphatase 39 38 - 126 U/L   Total Bilirubin 0.7 0.3 - 1.2 mg/dL   GFR, Estimated >53 >29 mL/min     Comment: (NOTE) Calculated using the CKD-EPI Creatinine Equation (2021)    Anion gap 13 5 - 15    Comment: Performed at [facility], [address], [place], [place] [address]  Lipase, blood     Status: Abnormal   Collection Time: 07/13/20  4:53 PM  Result Value Ref Range   Lipase 340 (H) 11 - 51 U/L    Comment: Performed at [facility], [address], [place], [place] [address]  CBC with Differential     Status: Abnormal   Collection Time: 07/13/20  4:53 PM  Result Value Ref Range   WBC 11.6 (H) 4.0 - 10.5 K/uL   RBC 5.07 4.22 - 5.81 MIL/uL   Hemoglobin 13.5 13.0 - 17.0 g/dL   HCT 62.2 29.7 - 98.9 %   MCV 81.5 80.0 - 100.0 fL   MCH 26.6 26.0 - 34.0 pg   MCHC 32.7 30.0 - 36.0 g/dL   RDW 21.1 94.1 - 74.0 %   Platelets 327 150 - 400 K/uL   nRBC 0.0 0.0 - 0.2 %   Neutrophils Relative % 81 %   Neutro Abs 9.4 (H) 1.7 -  7.7 K/uL   Lymphocytes Relative 11 %   Lymphs Abs 1.3 0.7 - 4.0 K/uL   Monocytes Relative 7 %   Monocytes Absolute 0.9 0.1 - 1.0 K/uL   Eosinophils Relative 0 %   Eosinophils Absolute 0.0 0.0 - 0.5 K/uL   Basophils Relative 0 %   Basophils Absolute 0.0 0.0 - 0.1 K/uL   Immature Granulocytes 1 %   Abs Immature Granulocytes 0.06 0.00 - 0.07 K/uL    Comment: Performed at [facility], [address] [place], [place] [address]    CT ABDOMEN PELVIS WO CONTRAST  Result Date: 07/13/2020 CLINICAL DATA:  Abdominal pain for 2 days.  Appendicitis suspected. EXAM: CT ABDOMEN AND PELVIS WITHOUT CONTRAST TECHNIQUE: Multidetector CT imaging of the abdomen and pelvis was performed following the standard protocol without IV contrast. COMPARISON:  None. FINDINGS: Lower chest: Motion degradation. Mild bibasilar scarring. Normal heart size without pericardial or pleural effusion. Hepatobiliary: Motion degradation continuing into the upper abdomen. Mild hepatic steatosis. Grossly normal appearance of the gallbladder, biliary tract. Pancreas: Normal, without mass or  ductal dilatation. Spleen: Normal in size, without focal abnormality. Adrenals/Urinary Tract: Normal adrenal glands. No gross renal calculi or hydronephrosis. No hydroureter or ureteric calculi. No bladder calculi. Stomach/Bowel: Normal stomach, without wall thickening. Normal colon and terminal ileum. Right lower quadrant inflammation is likely centered around the appendix, suboptimally evaluated secondary to nondedicated technique. Example cylindrical structure of 11 mm on coronal image 46 with adjacent edema and likely extraluminal gas on coronal image 47 and transverse image 63. Normal small bowel. Vascular/Lymphatic: Normal caliber of the aorta and branch vessels. No abdominopelvic adenopathy. Reproductive: Mild prostatomegaly. Other: Trace pelvic fluid.  No gross free perforation. Musculoskeletal: No acute osseous abnormality. IMPRESSION: 1. Multifactorial degradation, including motion and nondedicated technique (lack of oral or IV contrast). 2. Right lower quadrant inflammation, favored to be centered about the appendix. Surrounding edema and extraluminal gas suggest microperforation. No abscess or free extraluminal gas. 3. Mild hepatic steatosis. 4. Mild prostatomegaly. A call to the clinical service is pending. Electronically Signed   By: Jeronimo Greaves M.D.   On: 07/13/2020 17:47    ROS - all of the below systems have been reviewed with the patient and positives are indicated with bold text General: chills, fever or night sweats Eyes: blurry vision or double vision ENT: epistaxis or sore throat Allergy/Immunology: itchy/watery eyes or nasal congestion Hematologic/Lymphatic: bleeding problems, blood clots or swollen lymph nodes Endocrine: temperature intolerance or unexpected weight changes Breast: new or changing breast lumps or nipple discharge Resp: cough, shortness of breath, or wheezing CV: chest pain or dyspnea on exertion GI: as per HPI GU: dysuria, trouble voiding, or hematuria MSK:  joint pain or joint stiffness Neuro: TIA or stroke symptoms Derm: pruritus and skin lesion changes Psych: anxiety and depression  PE Blood pressure 115/73, pulse (!) 105, temperature (!) 101.2 F (38.4 C), temperature source Oral, resp. rate 14, SpO2 98 %. Constitutional: NAD; conversant; no deformities Eyes: Moist conjunctiva; no lid lag; anicteric; PERRL Neck: Trachea midline; no thyromegaly Lungs: Normal respiratory effort; no tactile fremitus CV: RRR; no palpable thrills; no pitting edema GI: Abdomen is soft, focally tender to palpation in the right lower quadrant with localized guarding; no tenderness elsewhere specifically no tenderness across midepigastrium; nondistended; no palpable hepatosplenomegaly MSK: Normal range of motion of extremities; no clubbing/cyanosis Psychiatric: Appropriate affect; alert and oriented x3 Lymphatic: No palpable cervical or axillary lymphadenopathy  Results for orders placed or performed during  the hospital encounter of 07/13/20 (from the past 48 hour(s))  Comprehensive metabolic panel     Status: Abnormal   Collection Time: 07/13/20  4:53 PM  Result Value Ref Range   Sodium 131 (L) 135 - 145 mmol/L   Potassium 4.3 3.5 - 5.1 mmol/L   Chloride 92 (L) 98 - 111 mmol/L   CO2 26 22 - 32 mmol/L   Glucose, Bld 221 (H) 70 - 99 mg/dL    Comment: Glucose reference range applies only to samples taken after fasting for at least 8 hours.   BUN 18 6 - 20 mg/dL   Creatinine, Ser 7.58 0.61 - 1.24 mg/dL   Calcium 9.3 8.9 - 83.2 mg/dL   Total Protein 8.3 (H) 6.5 - 8.1 g/dL   Albumin 4.1 3.5 - 5.0 g/dL   AST 17 15 - 41 U/L   ALT 16 0 - 44 U/L   Alkaline Phosphatase 39 38 - 126 U/L   Total Bilirubin 0.7 0.3 - 1.2 mg/dL   GFR, Estimated >54 >98 mL/min    Comment: (NOTE) Calculated using the CKD-EPI Creatinine Equation (2021)    Anion gap 13 5 - 15    Comment: Performed at [facility], [address] [place], [place] [address]  Lipase, blood      Status: Abnormal   Collection Time: 07/13/20  4:53 PM  Result Value Ref Range   Lipase 340 (H) 11 - 51 U/L    Comment: Performed at [facility], [address], [place], [place] [address]  CBC with Differential     Status: Abnormal   Collection Time: 07/13/20  4:53 PM  Result Value Ref Range   WBC 11.6 (H) 4.0 - 10.5 K/uL   RBC 5.07 4.22 - 5.81 MIL/uL   Hemoglobin 13.5 13.0 - 17.0 g/dL   HCT 07.6 80.8 - 81.1 %   MCV 81.5 80.0 - 100.0 fL   MCH 26.6 26.0 - 34.0 pg   MCHC 32.7 30.0 - 36.0 g/dL   RDW 03.1 59.4 - 58.5 %   Platelets 327 150 - 400 K/uL   nRBC 0.0 0.0 - 0.2 %   Neutrophils Relative % 81 %   Neutro Abs 9.4 (H) 1.7 - 7.7 K/uL   Lymphocytes Relative 11 %   Lymphs Abs 1.3 0.7 - 4.0 K/uL   Monocytes Relative 7 %   Monocytes Absolute 0.9 0.1 - 1.0 K/uL   Eosinophils Relative 0 %   Eosinophils Absolute 0.0 0.0 - 0.5 K/uL   Basophils Relative 0 %   Basophils Absolute 0.0 0.0 - 0.1 K/uL   Immature Granulocytes 1 %   Abs Immature Granulocytes 0.06 0.00 - 0.07 K/uL    Comment: Performed at [facility], [address], [place], [place] [address]    CT ABDOMEN PELVIS WO CONTRAST  Result Date: 07/13/2020 CLINICAL DATA:  Abdominal pain for 2 days.  Appendicitis suspected. EXAM: CT ABDOMEN AND PELVIS WITHOUT CONTRAST TECHNIQUE: Multidetector CT imaging of the abdomen and pelvis was performed following the standard protocol without IV contrast. COMPARISON:  None. FINDINGS: Lower chest: Motion degradation. Mild bibasilar scarring. Normal heart size without pericardial or pleural effusion. Hepatobiliary: Motion degradation continuing into the upper abdomen. Mild hepatic steatosis. Grossly normal appearance of the gallbladder, biliary tract. Pancreas: Normal, without mass or ductal dilatation. Spleen: Normal in size, without focal abnormality. Adrenals/Urinary Tract: Normal adrenal glands. No gross renal calculi or hydronephrosis. No hydroureter or ureteric calculi. No bladder  calculi. Stomach/Bowel: Normal stomach, without wall thickening. Normal colon and terminal ileum. Right lower quadrant inflammation is likely centered around the appendix, suboptimally evaluated secondary to nondedicated technique. Example cylindrical structure of 11 mm on coronal image 46 with adjacent edema and likely extraluminal gas on coronal image 47 and transverse image 63. Normal small bowel. Vascular/Lymphatic: Normal caliber of the aorta and branch vessels. No abdominopelvic adenopathy. Reproductive: Mild prostatomegaly. Other: Trace pelvic fluid.  No gross free perforation. Musculoskeletal: No acute osseous abnormality. IMPRESSION: 1. Multifactorial degradation, including motion and nondedicated technique (lack of oral or IV contrast). 2. Right lower quadrant inflammation, favored to be centered about the appendix. Surrounding edema and extraluminal gas suggest microperforation. No abscess or free extraluminal gas. 3. Mild hepatic steatosis. 4. Mild prostatomegaly. A call to the clinical service is pending. Electronically Signed   By: Jeronimo GreavesKyle  Talbot M.D.   On: 07/13/2020 17:47     A/P: Leticia ClasMubarak M.E Janine LimboMohammed Ali is an 60 y.o. male with hx HTN, HLD, DM here with acute appendicitis  -We offered option of translator but he declined - stated he feels comfortable going through all of this in [place] -We spent time reviewing his CT scan and going over everything with him. He does have underlying diabetes, has been tachycardic and febrile here. There is question of microperforation on CT near mid portion/tip. There is no large fluid collection or abscess evident.  -The anatomy and physiology of the GI tract was discussed with the patient. The pathophysiology of appendicitis was discussed as well. -We reviewed options moving forward for treatment, covering IV abx vs surgery. We discussed that with antibiotics alone, there is reasonable success in managing appendicitis, however, risks of recurrence at 6168yrs  being as high as 40% in some studies. We discussed appendectomy - laparoscopic and potential open techniques as well as scenarios where an ileocecectomy could be necessary. We discussed the material risks (including, but not limited to, pain, bleeding, infection, scarring, need for blood transfusion, damage to surrounding structures- blood vessels/nerves/viscus/organs, damage to ureter/bladder, urine leak, leak from staple line, need for additional procedures, hernia, recurrence although quite low, pneumonia, heart attack, stroke, death) benefits and alternatives to surgery were discussed. The patient's questions were answered to his satisfaction, he voiced understanding and elected to proceed with surgery. Additionally, we discussed typical postoperative expectations and the recovery process.  Marin Olphristopher Jameela Michna, MD [facility] Use [email or web address] to contact on call provider

## 2020-07-13 NOTE — Anesthesia Procedure Notes (Signed)
Procedure Name: Intubation Date/Time: 07/13/2020 9:15 PM Performed by: Oletta Lamas, CRNA Pre-anesthesia Checklist: Patient identified, Emergency Drugs available, Suction available and Patient being monitored Patient Re-evaluated:Patient Re-evaluated prior to induction Oxygen Delivery Method: Circle System Utilized Preoxygenation: Pre-oxygenation with 100% oxygen Induction Type: IV induction Ventilation: Mask ventilation without difficulty Laryngoscope Size: Mac and 4 Grade View: Grade I Tube type: Oral Tube size: 7.5 mm Number of attempts: 1 Airway Equipment and Method: Stylet Placement Confirmation: ETT inserted through vocal cords under direct vision, positive ETCO2 and breath sounds checked- equal and bilateral Secured at: 23 cm Tube secured with: Tape Dental Injury: Teeth and Oropharynx as per pre-operative assessment

## 2020-07-13 NOTE — Anesthesia Preprocedure Evaluation (Signed)
Anesthesia Evaluation  Patient identified by MRN, date of birth, ID band Patient awake    Reviewed: Allergy & Precautions, NPO status , Patient's Chart, lab work & pertinent test results  History of Anesthesia Complications Negative for: history of anesthetic complications  Airway Mallampati: III  TM Distance: <3 FB Neck ROM: Full    Dental  (+) Dental Advisory Given, Teeth Intact   Pulmonary neg shortness of breath, neg sleep apnea, neg COPD, neg recent URI,  Covid-19 Nucleic Acid Test Results Lab Results      Component                Value               Date                      SARSCOV2NAA              NEGATIVE            07/13/2020                SARSCOV2NAA              POSITIVE (A)        12/05/2018              breath sounds clear to auscultation       Cardiovascular hypertension, Pt. on medications (-) angina(-) Past MI and (-) CHF  Rhythm:Regular     Neuro/Psych negative neurological ROS  negative psych ROS   GI/Hepatic Neg liver ROS, appendicitis   Endo/Other  diabetes, Type 2  Renal/GU negative Renal ROS     Musculoskeletal negative musculoskeletal ROS (+)   Abdominal   Peds  Hematology negative hematology ROS (+)   Anesthesia Other Findings   Reproductive/Obstetrics                             Anesthesia Physical Anesthesia Plan  ASA: 2  Anesthesia Plan: General   Post-op Pain Management:    Induction: Intravenous  PONV Risk Score and Plan: 2 and Ondansetron and Dexamethasone  Airway Management Planned: Oral ETT  Additional Equipment: None  Intra-op Plan:   Post-operative Plan: Extubation in OR  Informed Consent: I have reviewed the patients History and Physical, chart, labs and discussed the procedure including the risks, benefits and alternatives for the proposed anesthesia with the patient or authorized representative who has indicated his/her  understanding and acceptance.     Dental advisory given  Plan Discussed with: CRNA and Surgeon  Anesthesia Plan Comments:         Anesthesia Quick Evaluation

## 2020-07-13 NOTE — Transfer of Care (Signed)
Immediate Anesthesia Transfer of Care Note  Patient: Todd Barnes  Procedure(s) Performed: APPENDECTOMY LAPAROSCOPIC (Abdomen)  Patient Location: PACU  Anesthesia Type:General  Level of Consciousness: awake, alert  and oriented  Airway & Oxygen Therapy: Patient Spontanous Breathing  Post-op Assessment: Report given to RN and Post -op Vital signs reviewed and stable  Post vital signs: Reviewed and stable  Last Vitals:  Vitals Value Taken Time  BP    Temp    Pulse    Resp    SpO2      Last Pain:  Vitals:   07/13/20 2021  TempSrc:   PainSc: 0-No pain         Complications: No notable events documented.

## 2020-07-13 NOTE — Progress Notes (Signed)
Attempted to update family with no success.

## 2020-07-13 NOTE — ED Triage Notes (Signed)
Patient complains of lower abdominal pain since yesterday with 4 episodes of emesis yesterday. Denis diarrhea. Denies fever, denies chills

## 2020-07-13 NOTE — ED Provider Notes (Signed)
Emergency Medicine Provider Triage Evaluation Note  Ms [facility] Cuyler Vandyken , a 60 y.o. male  was evaluated in triage.  Pt complains of presents with abdominal pain, started yesterday, pain came on suddenly, pain is in his right lower quadrant, does not radiate, has associated nausea and vomiting, denies hematemesis or coffee-ground emesis, denies constipation or diarrhea, has no significant abdominal history.  Denies any urinary symptoms, has pain at this time..  Review of Systems  Positive: Abdominal pain, nausea vomiting Negative: Constipation, urinary symptoms  Physical Exam  BP 106/64 (BP Location: Left Arm)   Pulse 98   Temp (!) 101.2 F (38.4 C) (Oral)   Resp 16   SpO2 100%  Gen:   Awake, no distress   Resp:  Normal effort  MSK:   Moves extremities without difficulty  Other:  Patient has right lower quadrant pain no peritoneal signs or rebound tenderness.  Medical Decision Making  Medically screening exam initiated at 4:55 PM.  Appropriate orders placed.  [facility] [name] was informed that the remainder of the evaluation will be completed by another provider, this initial triage assessment does not replace that evaluation, and the importance of remaining in the ED until their evaluation is complete.  Abdominal pain, lab work and imaging have been ordered, patient need further work-up.   Carroll Sage, PA-C 07/13/20 1656    Derwood Kaplan, MD 07/13/20 1725

## 2020-07-13 NOTE — ED Notes (Signed)
Surgeon at bedside.  

## 2020-07-13 NOTE — Op Note (Signed)
[facility] [name] [id number]   PRE-OPERATIVE DIAGNOSIS:  Acute appendicitis  POST-OPERATIVE DIAGNOSIS:  Acute appendicitis with perforation and gangrene  PROCEDURE: Laparoscopic appendectomy  SURGEON:  Stephanie Coup. Cliffton Asters, M.D.  ANESTHESIA: General endotracheal  EBL:   10 mL  DRAINS: None  SPECIMEN:  Appendix  COUNTS:  Sponge, needle and instrument counts were reported correct x2 at conclusion of the operation  DISPOSITION:  PACU in satisfactory condition  COMPLICATIONS: None  FINDINGS: Purulent and feculent peritonitis. Acutely inflamed perforated appendix with gangrene on mid portion where the appendix has perforated. Exudate coating many loops of small bowel. 10 cc of purulent yellow fluid suctioned from the deep pelvis. Appendectomy carried out.  DESCRIPTION:   The patient was identified & brought into the operating room. SCDs were in place and functioning. General endotracheal anesthesia was administered. Preoperative antibiotics were administered. The patient was positioned supine with left arm tucked. Hair on the abdomen was then clipped by the OR team. A foley catheter was inserted under sterile conditions. The abdomen was prepped and draped in the standard sterile fashion. A surgical timeout confirmed our plan.  A small incision was made in the infraumbilical fold. The subcutaneous tissue was dissected and the umbilical stalk identified. The stalk was grasped with a Kocher and retracted outwardly. The infraumbilical fascia was exposed and incised. Peritoneal entry was carefully made bluntly. A 0 Vicryl purse-string suture was placed and then the [facility] port was introduced into the abdomen.  CO2 insufflation commenced to . The laparoscope was inserted and confirmed no evidence of trocar site complications. The patient was then positioned in Trendelenburg. Two additional ports were placed - one in left lower quadrant and another in the suprapubic midline taking  care to stay well above the bladder - 3 fingerbreadths above the pubic symphysis. The bed was then slightly tilted to place the left side down.  There is purulent fluid in the pelvis which is evacuated with a suction irrigator device. There is purulent fluid between loops of bowel which is also cleared. There is free floating fecaliths consistent with 'feculent peritonitis.'   Upon entering the abdomen (organ space), I encountered feculent peritonitis.  CASE DATA:  Type of patient?: DOW CASE (Surgical Hospitalist [facility])  Status of Case? EMERGENT Add On  Infection Present At Time Of Surgery (PATOS)?  FECULENT PERITONITIS   The appendix was identified and attachments to the appendix to the surrounding tissues were freed without difficulty.  The appendix was elevated.  The base of the appendix was circumferentially dissected taking care to preserve the cecum free of injury. The base was noted to be viable and healthy appearing. The terminal ileum, cecum and ascending colon also appeared normal. The base of the appendix was then stapled with a blue load, taking a small healthy cuff of viable cecum, taking care to stay clear of the ileocecal valve. The mesoappendix was then ligated by "hugging" the appendix using the harmonic scalpel. The mesoappendix was inspected and noted to be hemostatic. The appendix was placed in an EndoBag.  The right lower quadrant was conservatively irrigated. Hemostasis was noted to be achieved - taking time to inspect the ligated mesoappendix, colon mesentery, and retroperitoneum. Staple line was noted to be intact on the cecum with no bleeding. There was no perforation or injury. The right lower quadrant appeared clean and as such, no drain was placed.  The left lower quadrant and suprapubic ports were removed under direct visualization. The EndoBag was then removed through the  umbilical port site and passed off as specimen. The CO2 was exhausted from the abdomen.  The umbilical fascia was then closed by closing the 0 Vicryl suture. The fascia was palpated and noted to be completely closed. Sponge, needle and instrument counts are reported correct x2. The skin of all port sites was then approximated using 4-0 Monocryl suture. The incisions were covered with Dermabond.  He was then awakened from general anesthesia, extubated, and transferred to a stretcher for transport to recover in satisfactory condition.

## 2020-07-13 NOTE — ED Provider Notes (Signed)
[facility] EMERGENCY DEPARTMENT Provider Note   CSN: [id number] Arrival date & time: 07/13/20  1620     History No chief complaint on file.   Efrain M.E Shirline Frees [name] is a 60 y.o. male with a past medical history of diabetes, hypertension, hyperlipidemia presenting to the ED with a chief complaint of right lower quadrant abdominal pain.  Symptoms began yesterday and worsened today.  He has had several episodes of nonbloody, nonbilious emesis since symptoms began.  No sick contacts with similar symptoms.  No changes to bowel movements or urination.  He took Benadryl approximately 8 hours ago to try to help with symptoms.  Last meal was approximately 8 hours ago.  No prior abdominal surgeries.  HPI     Past Medical History:  Diagnosis Date  . Diabetes mellitus without complication (HCC)   . Hyperlipidemia   . Hypertension     Patient Active Problem List   Diagnosis Date Noted  . Uncontrolled type 2 diabetes mellitus with hyperglycemia (HCC) 11/07/2019  . Controlled type 2 diabetes mellitus without complication, without long-term current use of insulin (HCC) 11/01/2016  . Essential hypertension 11/01/2016  . Hyperlipidemia 11/01/2016    History reviewed. No pertinent surgical history.     Family History  Problem Relation Age of Onset  . Diabetes Mother     Social History   Tobacco Use  . Smoking status: Never  . Smokeless tobacco: Never  Vaping Use  . Vaping Use: Never used  Substance Use Topics  . Alcohol use: No  . Drug use: No    Home Medications Prior to Admission medications   Medication Sig Start Date End Date Taking? Authorizing Provider  acetaminophen (TYLENOL) 500 MG tablet Take 1 tablet (500 mg total) by mouth every 6 (six) hours as needed for mild pain, moderate pain, fever or headache. 08/22/19   Arnetha Courser, MD  aspirin 81 MG chewable tablet CHEW 1 TABLET BY MOUTH EVERY DAY 06/18/20 06/18/21  Lynne Leader, MD  atenolol  (TENORMIN) 50 MG tablet TAKE ONE TABLET BY MOUTH EVERY DAY 06/18/20 05/28/21  Lynne Leader, MD  fenofibrate (TRICOR) 145 MG tablet TAKE ONE TABLET BY MOUTH EVERY DAY 06/18/20 04/27/21  Lynne Leader, MD  glimepiride (AMARYL) 4 MG tablet Take 1 tablet (4 mg total) by mouth 2 (two) times daily. 06/18/20   Lynne Leader, MD  hydrochlorothiazide (HYDRODIURIL) 25 MG tablet TAKE 0.5 TABLET BY MOUTH 2 TIMES A DAY. TAKE WITH LISINOPRIL 06/18/20 06/18/21  Lynne Leader, MD  hydrochlorothiazide (MICROZIDE) 12.5 MG capsule TAKE ONE CAPSULE BY MOUTH 2 TIMES A DAY 08/22/19 08/21/20  Arnetha Courser, MD  lisinopril (ZESTRIL) 20 MG tablet TAKE ONE TABLET BY MOUTH 2 TIMES A DAY 06/18/20 06/18/21  Lynne Leader, MD  lisinopril-hydrochlorothiazide (ZESTORETIC) 20-12.5 MG tablet 1 tab po bid 08/22/19   Arnetha Courser, MD  lovastatin (MEVACOR) 20 MG tablet Take 1 tablet (20 mg total) by mouth at bedtime. 06/18/20 06/18/21  Lynne Leader, MD  sitaGLIPtin-metformin (JANUMET) 50-1000 MG tablet TAKE ONE TABLET BY MOUTH 2 TIMES A DAY WITH MEALS 02/06/20 02/05/21  Sherron Monday, MD    Allergies    Patient has no known allergies.  Review of Systems   Review of Systems  Constitutional:  Negative for appetite change, chills and fever.  HENT:  Negative for ear pain, rhinorrhea, sneezing and sore throat.   Eyes:  Negative for photophobia and visual disturbance.  Respiratory:  Negative for cough, chest tightness, shortness of breath and wheezing.  Cardiovascular:  Negative for chest pain and palpitations.  Gastrointestinal:  Positive for abdominal pain and vomiting. Negative for blood in stool, constipation, diarrhea and nausea.  Genitourinary:  Negative for dysuria, hematuria and urgency.  Musculoskeletal:  Negative for myalgias.  Skin:  Negative for rash.  Neurological:  Negative for dizziness, weakness and light-headedness.   Physical Exam Updated Vital Signs BP 123/73   Pulse 97   Temp (!) 101.2 F (38.4 C) (Oral)    Resp 17   SpO2 99%   Physical Exam Vitals and nursing note reviewed.  Constitutional:      General: He is not in acute distress.    Appearance: He is well-developed.  HENT:     Head: Normocephalic and atraumatic.     Nose: Nose normal.  Eyes:     General: No scleral icterus.       Left eye: No discharge.     Conjunctiva/sclera: Conjunctivae normal.  Cardiovascular:     Rate and Rhythm: Normal rate and regular rhythm.     Heart sounds: Normal heart sounds. No murmur heard.   No friction rub. No gallop.  Pulmonary:     Effort: Pulmonary effort is normal. No respiratory distress.     Breath sounds: Normal breath sounds.  Abdominal:     General: Bowel sounds are normal. There is no distension.     Palpations: Abdomen is soft.     Tenderness: There is abdominal tenderness (Right lower quadrant). There is no guarding.  Musculoskeletal:        General: Normal range of motion.     Cervical back: Normal range of motion and neck supple.  Skin:    General: Skin is warm and dry.     Findings: No rash.  Neurological:     Mental Status: He is alert.     Motor: No abnormal muscle tone.     Coordination: Coordination normal.    ED Results / Procedures / Treatments   Labs (all labs ordered are listed, but only abnormal results are displayed) Labs Reviewed  COMPREHENSIVE METABOLIC PANEL - Abnormal; Notable for the following components:      Result Value   Sodium 131 (*)    Chloride 92 (*)    Glucose, Bld 221 (*)    Total Protein 8.3 (*)    All other components within normal limits  LIPASE, BLOOD - Abnormal; Notable for the following components:   Lipase 340 (*)    All other components within normal limits  CBC WITH DIFFERENTIAL/PLATELET - Abnormal; Notable for the following components:   WBC 11.6 (*)    Neutro Abs 9.4 (*)    All other components within normal limits  LACTIC ACID, PLASMA - Abnormal; Notable for the following components:   Lactic Acid, Venous 2.4 (*)    All  other components within normal limits  RESP PANEL BY RT-PCR (FLU A&B, COVID) [id number]  LACTIC ACID, PLASMA  TYPE AND SCREEN    EKG None  Radiology CT ABDOMEN PELVIS WO CONTRAST  Result Date: 07/13/2020 CLINICAL DATA:  Abdominal pain for 2 days.  Appendicitis suspected. EXAM: CT ABDOMEN AND PELVIS WITHOUT CONTRAST TECHNIQUE: Multidetector CT imaging of the abdomen and pelvis was performed following the standard protocol without IV contrast. COMPARISON:  None. FINDINGS: Lower chest: Motion degradation. Mild bibasilar scarring. Normal heart size without pericardial or pleural effusion. Hepatobiliary: Motion degradation continuing into the upper abdomen. Mild hepatic steatosis. Grossly normal appearance of the gallbladder, biliary tract. Pancreas: Normal, without mass  or ductal dilatation. Spleen: Normal in size, without focal abnormality. Adrenals/Urinary Tract: Normal adrenal glands. No gross renal calculi or hydronephrosis. No hydroureter or ureteric calculi. No bladder calculi. Stomach/Bowel: Normal stomach, without wall thickening. Normal colon and terminal ileum. Right lower quadrant inflammation is likely centered around the appendix, suboptimally evaluated secondary to nondedicated technique. Example cylindrical structure of 11 mm on coronal image 46 with adjacent edema and likely extraluminal gas on coronal image 47 and transverse image 63. Normal small bowel. Vascular/Lymphatic: Normal caliber of the aorta and branch vessels. No abdominopelvic adenopathy. Reproductive: Mild prostatomegaly. Other: Trace pelvic fluid.  No gross free perforation. Musculoskeletal: No acute osseous abnormality. IMPRESSION: 1. Multifactorial degradation, including motion and nondedicated technique (lack of oral or IV contrast). 2. Right lower quadrant inflammation, favored to be centered about the appendix. Surrounding edema and extraluminal gas suggest microperforation. No abscess or free extraluminal gas. 3. Mild hepatic  steatosis. 4. Mild prostatomegaly. A call to the clinical service is pending. Electronically Signed   By: Jeronimo Greaves M.D.   On: 07/13/2020 17:47    Procedures Procedures   Medications Ordered in ED Medications  cefTRIAXone (ROCEPHIN) 2 g in sodium chloride 0.9 % 100 mL IVPB (0 g Intravenous Stopped 07/13/20 1953)    And  metroNIDAZOLE (FLAGYL) IVPB 500 mg (500 mg Intravenous New Bag/Given 07/13/20 2021)  0.9 % irrigation (POUR BTL) (1,000 mLs Irrigation Given 07/13/20 1922)  acetaminophen (TYLENOL) tablet 650 mg (650 mg Oral Given 07/13/20 1923)  sodium chloride 0.9 % bolus 1,000 mL (0 mLs Intravenous Stopped 07/13/20 2019)    ED Course  I have reviewed the triage vital signs and the nursing notes.  Pertinent labs & imaging results that were available during my care of the patient were reviewed by me and considered in my medical decision making (see chart for details).  Clinical Course as of 07/13/20 2037  Wed Jul 13, 2020  1816 WBC(!): 11.6 [HK]  1821 Lactic Acid, Venous(!!): 2.4 [HK]  1849 Lipase(!): 340 [HK]    Clinical Course User Index [HK] Dietrich Pates, PA-C   MDM Rules/Calculators/A&P                          60 year old male with past medical history of hypertension, diabetes, hyperlipidemia presenting to the ED for right lower quadrant pain since yesterday.  It worsened today.  Upon arrival to the ER patient febrile to 101.2.  He was given an antipyretic.  He has a leukocytosis of 11.6.  CT scan done in triage shows right lower quadrant inflammation concerning for appendicitis with possible microperforation.  No evidence of abscess.  Last meal was approximately 8 hours ago.  Will give antibiotics, IV fluids, wait on remainder of lab work.  I will consult general surgery.  Spoke to on-call general surgeon, Dr. Cliffton Asters.  Will take to the OR.  COVID test is pending.  He has an elevated lactic acid level at 2.4 and elevated lipase at 340.  We will give IV fluids.  Remains  NPO. Marland Kitchen   Portions of this note were generated with Scientist, clinical (histocompatibility and immunogenetics). Dictation errors may occur despite best attempts at proofreading.  Final Clinical Impression(s) / ED Diagnoses Final diagnoses:  Acute appendicitis, unspecified acute appendicitis type    Rx / DC Orders ED Discharge Orders     None        Dietrich Pates, PA-C 07/13/20 2037    Milagros Loll, MD 07/15/20 1142

## 2020-07-13 NOTE — Progress Notes (Signed)
Pharmacy Antibiotic Note  Duward Allbritton Shirline Frees [name] is a 60 y.o. male admitted on 07/13/2020 with  acute appendicitis with perforation and gangrene .  Pharmacy has been consulted for Zosyn dosing.  Plan: Zosyn 3.375g IV q8h (4 hour infusion).  Height: 5\' 9"  (175.3 cm) Weight: 80.3 kg (177 lb) IBW/kg (Calculated) : 70.7  Temp (24hrs), Avg:99.2 F (37.3 C), Min:97.1 F (36.2 C), Max:101.2 F (38.4 C)  Recent Labs  Lab 07/13/20 1653 07/13/20 2050  WBC 11.6*  --   CREATININE 1.17  --   LATICACIDVEN 2.4* 1.8    Estimated Creatinine Clearance: 68.8 mL/min (by C-G formula based on SCr of 1.17 mg/dL).    No Known Allergies   Thank you for allowing pharmacy to be a part of this patient's care.  07/15/20, PharmD, BCPS  07/13/2020 10:48 PM

## 2020-07-14 ENCOUNTER — Encounter (HOSPITAL_COMMUNITY): Payer: Self-pay | Admitting: Surgery

## 2020-07-14 LAB — BASIC METABOLIC PANEL
Anion gap: 8 (ref 5–15)
BUN: 17 mg/dL (ref 6–20)
CO2: 24 mmol/L (ref 22–32)
Calcium: 8.4 mg/dL — ABNORMAL LOW (ref 8.9–10.3)
Chloride: 97 mmol/L — ABNORMAL LOW (ref 98–111)
Creatinine, Ser: 1.07 mg/dL (ref 0.61–1.24)
GFR, Estimated: >60 mL/min (ref >60)
Glucose, Bld: 261 mg/dL — ABNORMAL HIGH (ref 70–99)
Potassium: 4.2 mmol/L (ref 3.5–5.1)
Sodium: 129 mmol/L — ABNORMAL LOW (ref 135–145)

## 2020-07-14 LAB — CBC
HCT: 32.9 % — ABNORMAL LOW (ref 39.0–52.0)
Hemoglobin: 11.2 g/dL — ABNORMAL LOW (ref 13.0–17.0)
MCH: 27.7 pg (ref 26.0–34.0)
MCHC: 34 g/dL (ref 30.0–36.0)
MCV: 81.2 fL (ref 80.0–100.0)
Platelets: 251 10*3/uL (ref 150–400)
RBC: 4.05 MIL/uL — ABNORMAL LOW (ref 4.22–5.81)
RDW: 12.6 % (ref 11.5–15.5)
WBC: 11.1 10*3/uL — ABNORMAL HIGH (ref 4.0–10.5)
nRBC: 0.2 % (ref 0.0–0.2)

## 2020-07-14 LAB — HIV ANTIBODY (ROUTINE TESTING W REFLEX): HIV Screen 4th Generation wRfx: NONREACTIVE

## 2020-07-14 LAB — GLUCOSE, CAPILLARY
Glucose-Capillary: 251 mg/dL — ABNORMAL HIGH (ref 70–99)
Glucose-Capillary: 272 mg/dL — ABNORMAL HIGH (ref 70–99)
Glucose-Capillary: 159 mg/dL — ABNORMAL HIGH (ref 70–99)
Glucose-Capillary: 119 mg/dL — ABNORMAL HIGH (ref 70–99)
Glucose-Capillary: 146 mg/dL — ABNORMAL HIGH (ref 70–99)

## 2020-07-14 LAB — MAGNESIUM: Magnesium: 1.6 mg/dL — ABNORMAL LOW (ref 1.7–2.4)

## 2020-07-14 LAB — PHOSPHORUS: Phosphorus: 2.7 mg/dL (ref 2.5–4.6)

## 2020-07-14 LAB — ABO/RH: ABO/RH(D): B POS

## 2020-07-14 MED ORDER — LISINOPRIL 20 MG PO TABS
20.0000 mg | ORAL_TABLET | Freq: Two times a day (BID) | ORAL | Status: DC
  Administered 2020-07-14 – 2020-07-15 (×4): 20 mg via ORAL
  Filled 2020-07-14 (×4): qty 1

## 2020-07-14 MED ORDER — HYDROCHLOROTHIAZIDE 12.5 MG PO CAPS
12.5000 mg | ORAL_CAPSULE | Freq: Two times a day (BID) | ORAL | Status: DC
  Administered 2020-07-14 – 2020-07-15 (×4): 12.5 mg via ORAL
  Filled 2020-07-14 (×4): qty 1

## 2020-07-14 MED ORDER — INSULIN GLARGINE 100 UNIT/ML ~~LOC~~ SOLN
10.0000 [IU] | Freq: Every day | SUBCUTANEOUS | Status: DC
  Administered 2020-07-14 – 2020-07-15 (×2): 10 [IU] via SUBCUTANEOUS
  Filled 2020-07-14 (×2): qty 0.1

## 2020-07-14 MED ORDER — GLIMEPIRIDE 4 MG PO TABS
4.0000 mg | ORAL_TABLET | Freq: Two times a day (BID) | ORAL | Status: DC
  Administered 2020-07-14 – 2020-07-15 (×2): 4 mg via ORAL
  Filled 2020-07-14 (×4): qty 1

## 2020-07-14 NOTE — Progress Notes (Signed)
1 Day Post-Op   Subjective/Chief Complaint: No complaints, tol clears, voiding   Objective: Vital signs in last 24 hours: Temp:  [97.1 F (36.2 C)-101.2 F (38.4 C)] 98.3 F (36.8 C) (07/14 0740) Pulse Rate:  [83-110] 83 (07/14 0740) Resp:  [14-19] 17 (07/14 0740) BP: (97-129)/(53-74) 110/66 (07/14 0740) SpO2:  [94 %-100 %] 99 % (07/14 0740) Weight:  [80.3 kg] 80.3 kg (07/13 2240) Last BM Date: 07/13/20  Intake/Output from previous day: 07/13 0701 - 07/14 0700 In: 2100.1 [I.V.:1000; IV Piggyback:1100.1] Out: 1085 [Urine:1075; Blood:10] Intake/Output this shift: No intake/output data recorded.  General nad Cv rrr  Pulm nl effort Abd soft approp tender incisions clean  Lab Results:  Recent Labs    07/13/20 1653 07/14/20 0217  WBC 11.6* 11.1*  HGB 13.5 11.2*  HCT 41.3 32.9*  PLT 327 251   BMET Recent Labs    07/13/20 1653 07/14/20 0217  NA 131* 129*  K 4.3 4.2  CL 92* 97*  CO2 26 24  GLUCOSE 221* 261*  BUN 18 17  CREATININE 1.17 1.07  CALCIUM 9.3 8.4*   PT/INR No results for input(s): LABPROT, INR in the last 72 hours. ABG No results for input(s): PHART, HCO3 in the last 72 hours.  Invalid input(s): PCO2, PO2  Studies/Results: CT ABDOMEN PELVIS WO CONTRAST  Result Date: 07/13/2020 CLINICAL DATA:  Abdominal pain for 2 days.  Appendicitis suspected. EXAM: CT ABDOMEN AND PELVIS WITHOUT CONTRAST TECHNIQUE: Multidetector CT imaging of the abdomen and pelvis was performed following the standard protocol without IV contrast. COMPARISON:  None. FINDINGS: Lower chest: Motion degradation. Mild bibasilar scarring. Normal heart size without pericardial or pleural effusion. Hepatobiliary: Motion degradation continuing into the upper abdomen. Mild hepatic steatosis. Grossly normal appearance of the gallbladder, biliary tract. Pancreas: Normal, without mass or ductal dilatation. Spleen: Normal in size, without focal abnormality. Adrenals/Urinary Tract: Normal adrenal  glands. No gross renal calculi or hydronephrosis. No hydroureter or ureteric calculi. No bladder calculi. Stomach/Bowel: Normal stomach, without wall thickening. Normal colon and terminal ileum. Right lower quadrant inflammation is likely centered around the appendix, suboptimally evaluated secondary to nondedicated technique. Example cylindrical structure of 11 mm on coronal image 46 with adjacent edema and likely extraluminal gas on coronal image 47 and transverse image 63. Normal small bowel. Vascular/Lymphatic: Normal caliber of the aorta and branch vessels. No abdominopelvic adenopathy. Reproductive: Mild prostatomegaly. Other: Trace pelvic fluid.  No gross free perforation. Musculoskeletal: No acute osseous abnormality. IMPRESSION: 1. Multifactorial degradation, including motion and nondedicated technique (lack of oral or IV contrast). 2. Right lower quadrant inflammation, favored to be centered about the appendix. Surrounding edema and extraluminal gas suggest microperforation. No abscess or free extraluminal gas. 3. Mild hepatic steatosis. 4. Mild prostatomegaly. A call to the clinical service is pending. Electronically Signed   By: Jeronimo Greaves M.D.   On: 07/13/2020 17:47    Anti-infectives: Anti-infectives (From admission, onward)    Start     Dose/Rate Route Frequency Ordered Stop   07/14/20 0600  piperacillin-tazobactam (ZOSYN) IVPB 3.375 g        3.375 g 12.5 mL/hr over 240 Minutes Intravenous Every 8 hours 07/13/20 2251     07/13/20 2300  piperacillin-tazobactam (ZOSYN) IVPB 3.375 g        3.375 g 100 mL/hr over 30 Minutes Intravenous To Post Anesthesia Care Unit 07/13/20 2251 07/14/20 0116   07/13/20 1800  cefTRIAXone (ROCEPHIN) 2 g in sodium chloride 0.9 % 100 mL IVPB  See Hyperspace for full Linked Orders Report.   2 g 200 mL/hr over 30 Minutes Intravenous  Once 07/13/20 1752 07/13/20 1953   07/13/20 1800  metroNIDAZOLE (FLAGYL) IVPB 500 mg       See Hyperspace for full Linked  Orders Report.   500 mg 100 mL/hr over 60 Minutes Intravenous  Once 07/13/20 1752 07/13/20 2121       Assessment/Plan: POD 1 lap appy for perforated appendicitis- White -adat -24 hours iv abx then home on total five days -pulm toilet, ambulate -ssi and amaryl- follow glucose -home antihtn meds -sq heparin, scds    Todd Barnes 07/14/2020

## 2020-07-14 NOTE — Progress Notes (Addendum)
Inpatient Diabetes Program Recommendations  AACE/ADA: New Consensus Statement on Inpatient Glycemic Control (2015)  Target Ranges:  Prepandial:   less than 140 mg/dL      Peak postprandial:   less than 180 mg/dL (1-2 hours)      Critically ill patients:  140 - 180 mg/dL   Lab Results  Component Value Date   GLUCAP 272 (H) 07/14/2020   HGBA1C 8.5 (H) 05/16/2020    Review of Glycemic Control Results for AXTYN, WOEHLER (MRN [id number]) as of 07/14/2020 12:16  Ref. Range 07/13/2020 23:49 07/14/2020 04:07 07/14/2020 09:59  Glucose-Capillary Latest Ref Range: 70 - 99 mg/dL 124 (H) 580 (H) 998 (H)   Diabetes history:DM 2 Outpatient Diabetes medications:  Amaryl 4 mg bid Current orders for Inpatient glycemic control:  Novolog moderate tid with meals and HS Amaryl 4 mg bid Inpatient Diabetes Program Recommendations:   While in the hospital, consider adding Lantus 10 units daily.   Thanks,  Beryl Meager, RN, BC-ADM Inpatient Diabetes Coordinator Pager [phone] (8a-5p)

## 2020-07-14 NOTE — Plan of Care (Signed)

## 2020-07-15 LAB — GLUCOSE, CAPILLARY
Glucose-Capillary: 182 mg/dL — ABNORMAL HIGH (ref 70–99)
Glucose-Capillary: 246 mg/dL — ABNORMAL HIGH (ref 70–99)

## 2020-07-15 LAB — SURGICAL PATHOLOGY

## 2020-07-15 MED ORDER — DOCUSATE SODIUM 100 MG PO CAPS: 200.0000 mg | ORAL_CAPSULE | Freq: Every day | ORAL | 0 refills | Status: DC | PRN

## 2020-07-15 MED ORDER — TRAMADOL HCL 50 MG PO TABS: 50.0000 mg | ORAL_TABLET | Freq: Four times a day (QID) | ORAL | 0 refills | Status: AC | PRN

## 2020-07-15 MED ORDER — AMOXICILLIN-POT CLAVULANATE 875-125 MG PO TABS: 1.0000 | ORAL_TABLET | Freq: Two times a day (BID) | ORAL | 0 refills | Status: AC

## 2020-07-15 MED ORDER — IBUPROFEN 600 MG PO TABS
600.0000 mg | ORAL_TABLET | Freq: Four times a day (QID) | ORAL | 0 refills | Status: DC | PRN
Start: 2020-07-15 — End: 2023-04-08

## 2020-07-15 NOTE — Progress Notes (Signed)
Discharge instructions (including medications) discussed with and copy provided to patient/caregiver 

## 2020-07-15 NOTE — Plan of Care (Signed)
  Problem: Education: Goal: Knowledge of General Education information will improve Description: Including pain rating scale, medication(s)/side effects and non-pharmacologic comfort measures Outcome: Adequate for Discharge   

## 2020-07-15 NOTE — Progress Notes (Signed)
Patient was discharged and wheeled down in wheelchair with family and staff. Patient had all paperwork and belongings. Patient awake and alert in stable condition.

## 2020-07-15 NOTE — Discharge Summary (Signed)
[facility] Discharge Summary   Patient ID: Todd Barnes MRN: [id number] DOB/AGE: 1960-10-05 60 y.o.  Admit date: 07/13/2020 Discharge date: 07/15/2020  Admitting Diagnosis: Acute appendicitis  Discharge Diagnosis Acute appendicitis with perforation and gangrene s/p appendectomy  Consultants None  Imaging: CT ABDOMEN PELVIS WO CONTRAST  Result Date: 07/13/2020 CLINICAL DATA:  Abdominal pain for 2 days.  Appendicitis suspected. EXAM: CT ABDOMEN AND PELVIS WITHOUT CONTRAST TECHNIQUE: Multidetector CT imaging of the abdomen and pelvis was performed following the standard protocol without IV contrast. COMPARISON:  None. FINDINGS: Lower chest: Motion degradation. Mild bibasilar scarring. Normal heart size without pericardial or pleural effusion. Hepatobiliary: Motion degradation continuing into the upper abdomen. Mild hepatic steatosis. Grossly normal appearance of the gallbladder, biliary tract. Pancreas: Normal, without mass or ductal dilatation. Spleen: Normal in size, without focal abnormality. Adrenals/Urinary Tract: Normal adrenal glands. No gross renal calculi or hydronephrosis. No hydroureter or ureteric calculi. No bladder calculi. Stomach/Bowel: Normal stomach, without wall thickening. Normal colon and terminal ileum. Right lower quadrant inflammation is likely centered around the appendix, suboptimally evaluated secondary to nondedicated technique. Example cylindrical structure of 11 mm on coronal image 46 with adjacent edema and likely extraluminal gas on coronal image 47 and transverse image 63. Normal small bowel. Vascular/Lymphatic: Normal caliber of the aorta and branch vessels. No abdominopelvic adenopathy. Reproductive: Mild prostatomegaly. Other: Trace pelvic fluid.  No gross free perforation. Musculoskeletal: No acute osseous abnormality. IMPRESSION: 1. Multifactorial degradation, including motion and nondedicated technique (lack of oral or IV contrast). 2.  Right lower quadrant inflammation, favored to be centered about the appendix. Surrounding edema and extraluminal gas suggest microperforation. No abscess or free extraluminal gas. 3. Mild hepatic steatosis. 4. Mild prostatomegaly. A call to the clinical service is pending. Electronically Signed   By: Jeronimo Greaves M.D.   On: 07/13/2020 17:47    Procedures Dr. Cliffton Asters (07/15/20) - - Laparoscopic Appendectomy  Hospital Course:  60 yo male who presented to [facility] ED with 2 day history of worsening right lower quadrant abdominal pain.  He had associated nausea and vomiting.  Workup showed acute appendicitis.  Patient was admitted and underwent procedure listed above which showed perforated appendicitis with gangrene.  Tolerated procedure well and was transferred to the floor for IV antibiotics in setting of perforation.  Diet was advanced as tolerated.  On POD2, the patient was voiding well, tolerating diet, ambulating well, pain well controlled, vital signs stable, incisions c/d/i and felt stable for discharge home.  Patient will follow up in our office in 2-3 weeks and knows to call with questions or concerns.  He will call to confirm appointment date/time.    He will follow up with PCP for DM management.  He was discharged with tramadol to alternate with tylenol and ibuprofen for pain control. He was discharge with Augmentin to complete and 5 day course of antibiotics.  Discharge instructions were reviewed with patient (who declined translator) and his wife via telephone. All questions answered  Physical Exam: General:  Alert, NAD, pleasant, comfortable Abd:  Soft, ND, appropriate tenderness to palpation, incisions C/D/I with surgical glue.  I or a member of my team have reviewed this patient in the Controlled Substance Database.   Allergies as of 07/15/2020   No Known Allergies      Medication List     TAKE these medications    acetaminophen 500 MG tablet Commonly known as: TYLENOL Take 1  tablet (500 mg total) by mouth every 6 (six) hours  as needed for mild pain, moderate pain, fever or headache.   amoxicillin-clavulanate 875-125 MG tablet Commonly known as: Augmentin Take 1 tablet by mouth 2 (two) times daily for 3 days.   docusate sodium 100 MG capsule Commonly known as: COLACE Take 2 capsules (200 mg total) by mouth daily as needed for mild constipation.   glimepiride 4 MG tablet Commonly known as: AMARYL Take 1 tablet (4 mg total) by mouth 2 (two) times daily.   ibuprofen 600 MG tablet Commonly known as: ADVIL Take 1 tablet (600 mg total) by mouth every 6 (six) hours as needed for mild pain or moderate pain (try tyelenol first).   lovastatin 20 MG tablet Commonly known as: MEVACOR Take 1 tablet (20 mg total) by mouth at bedtime.   metFORMIN 1000 MG tablet Commonly known as: GLUCOPHAGE Take 1,000 mg by mouth 2 (two) times daily with a meal.   traMADol 50 MG tablet Commonly known as: ULTRAM Take 1 tablet (50 mg total) by mouth every 6 (six) hours as needed for up to 3 days for moderate pain or severe pain (pain not controlled with ibuprofen or tylenol first).       ASK your doctor about these medications    aspirin 81 MG chewable tablet CHEW 1 TABLET BY MOUTH EVERY DAY   atenolol 50 MG tablet Commonly known as: TENORMIN TAKE ONE TABLET BY MOUTH EVERY DAY   fenofibrate 145 MG tablet Commonly known as: TRICOR TAKE ONE TABLET BY MOUTH EVERY DAY   lisinopril-hydrochlorothiazide 20-12.5 MG tablet Commonly known as: Zestoretic 1 tab po bid          Follow-up Information     [facility]. Go on 08/08/2020.   Why: Post hospital follow up scheduled with your PCP on 08/08/2020 at 2:30 pm Contact information: [address] [place] [address] [phone]        [facility] Follow up in 1 week(s).   Specialty: General Surgery Why: we are working hard to schedule your follow up  appointment. Please call within 1 week of your surgery to confirm your appointment date and time Contact information: [address] [place] [address] [phone]                 Signed: Clarise Cruz [facility] 07/15/2020, 10:06 AM Please see Amion for pager number during day hours 7:00am-4:30pm

## 2020-07-15 NOTE — Discharge Instructions (Signed)
[facility] ______[facility] LAPAROSCOPIC SURGERY: POST OP INSTRUCTIONS Always review your discharge instruction sheet given to you by the facility where your surgery was performed. IF YOU HAVE DISABILITY OR FAMILY LEAVE FORMS, YOU MUST BRING THEM TO THE OFFICE FOR PROCESSING.   DO NOT GIVE THEM TO YOUR DOCTOR.  A prescription for pain medication may be given to you upon discharge.  Take your pain medication as prescribed, if needed.  If narcotic pain medicine is not needed, then you may take acetaminophen (Tylenol) or ibuprofen (Advil) as needed. Take your usually prescribed medications unless otherwise directed. If you need a refill on your pain medication, please contact your pharmacy.  They will contact our office to request authorization. Prescriptions will not be filled after 5pm or on week-ends. You should follow a light diet the first few days after arrival home, such as soup and crackers, etc.  Be sure to include lots of fluids daily. Most patients will experience some swelling and bruising in the area of the incisions.  Ice packs will help.  Swelling and bruising can take several days to resolve.  It is common to experience some constipation if taking pain medication after surgery.  Increasing fluid intake and taking a stool softener (such as Colace) will usually help or prevent this problem from occurring.  A mild laxative (Milk of Magnesia or Miralax) should be taken according to package instructions if there are no bowel movements after 48 hours. Unless discharge instructions indicate otherwise, you may remove your bandages 24-48 hours after surgery, and you may shower at that time.  You may have steri-strips (small skin tapes) in place directly over the incision.  These strips should be left on the skin for 7-10 days.  If your surgeon used skin glue on the incision, you may shower in 24 hours.  The glue will flake off over the next 2-3 weeks.  Any sutures or staples will be  removed at the office during your follow-up visit. ACTIVITIES:  You may resume regular (light) daily activities beginning the next day--such as daily self-care, walking, climbing stairs--gradually increasing activities as tolerated.  You may have sexual intercourse when it is comfortable.  Refrain from any heavy lifting or straining until approved by your doctor. You may drive when you are no longer taking prescription pain medication, you can comfortably wear a seatbelt, and you can safely maneuver your car and apply brakes. RETURN TO WORK:  __________________________________________________________ You should see your doctor in the office for a follow-up appointment approximately 2-3 weeks after your surgery.  Make sure that you call for this appointment within a day or two after you arrive home to insure a convenient appointment time. OTHER INSTRUCTIONS: __________________________________________________________________________________________________________________________ __________________________________________________________________________________________________________________________ WHEN TO CALL YOUR DOCTOR: Fever over 101.0 Inability to urinate Continued bleeding from incision. Increased pain, redness, or drainage from the incision. Increasing abdominal pain  The clinic staff is available to answer your questions during regular business hours.  Please don't hesitate to call and ask to speak to one of the nurses for clinical concerns.  If you have a medical emergency, go to the nearest emergency room or call 911.  A surgeon from [facility] is always on call at the hospital. [address], [address], [place], [place]  [address] ? [address] [address], [place],    [address] [phone] ? [phone] ? FAX [phone] Web site: [email or web address]  

## 2020-07-18 ENCOUNTER — Telehealth: Payer: Self-pay

## 2020-07-18 NOTE — Telephone Encounter (Signed)
Transition Care Management Unsuccessful Follow-up Telephone Call  Date of discharge and from where:  07/15/2020, [facility]   Attempts:  1st Attempt  Reason for unsuccessful TCM follow-up call:  Voice mail full # [phone], unable to leave a message

## 2020-07-19 ENCOUNTER — Telehealth: Payer: Self-pay

## 2020-07-19 NOTE — Telephone Encounter (Signed)
Transition Care Management Unsuccessful Follow-up Telephone Call  Date of discharge and from where:  07/15/2020, [facility]   Attempts:  2nd Attempt  Reason for unsuccessful TCM follow-up call:  Left voice message on # [phone]

## 2020-07-20 ENCOUNTER — Telehealth: Payer: Self-pay

## 2020-07-20 NOTE — Anesthesia Postprocedure Evaluation (Signed)
Anesthesia Post Note  Patient: Todd Barnes  Procedure(s) Performed: APPENDECTOMY LAPAROSCOPIC (Abdomen)     Patient location during evaluation: PACU Anesthesia Type: General Level of consciousness: awake and alert Pain management: pain level controlled Vital Signs Assessment: post-procedure vital signs reviewed and stable Respiratory status: spontaneous breathing, nonlabored ventilation, respiratory function stable and patient connected to nasal cannula oxygen Cardiovascular status: blood pressure returned to baseline and stable Postop Assessment: no apparent nausea or vomiting Anesthetic complications: no   No notable events documented.  Last Vitals:  Vitals:   07/14/20 2133 07/15/20 0739  BP: 116/76 123/78  Pulse: 83 80  Resp: 20 16  Temp: (!) 36.3 C 36.8 C  SpO2: 99% 98%    Last Pain:  Vitals:   07/15/20 0810  TempSrc:   PainSc: 0-No pain                 Jaritza Duignan

## 2020-07-20 NOTE — Telephone Encounter (Signed)
Transition Care Management Unsuccessful Follow-up Telephone Call  Date of discharge and from where:  07/15/2020, [facility]  Attempts:  3rd Attempt  Reason for unsuccessful TCM follow-up call:  Left voice message on # [phone].  Patient has appointment with Dr Laural Benes 08/08/2020 at [facility]

## 2020-08-08 ENCOUNTER — Inpatient Hospital Stay: Payer: Medicaid Other | Admitting: Internal Medicine

## 2020-08-12 ENCOUNTER — Other Ambulatory Visit: Payer: Self-pay | Admitting: Internal Medicine

## 2020-08-12 NOTE — Telephone Encounter (Signed)
   Notes to clinic:  script request has expired  Filled by historical provider Due for follow up   Requested Prescriptions  Pending Prescriptions Disp Refills   metFORMIN (GLUCOPHAGE) 1000 MG tablet [Pharmacy Med Name: metFORMIN HCl 1000 MG Oral Tablet] 90 tablet 0    Sig: Take 1/2 (one-half) tablet by mouth twice daily     Endocrinology:  Diabetes - Biguanides Failed - 08/12/2020 10:49 AM      Failed - HBA1C is between 0 and 7.9 and within 180 days    HbA1c, POC (controlled diabetic range)  Date Value Ref Range Status  11/07/2018 10.6 (A) 0.0 - 7.0 % Final   Hgb A1c MFr Bld  Date Value Ref Range Status  05/16/2020 8.5 (H) 4.8 - 5.6 % Final    Comment:             Prediabetes: 5.7 - 6.4          Diabetes: >6.4          Glycemic control for adults with diabetes: <7.0           Failed - Valid encounter within last 6 months    Recent Outpatient Visits           1 year ago Type 2 diabetes mellitus without complication, without long-term current use of insulin ([place])   [place], Neoma Laming B, MD   3 years ago Controlled type 2 diabetes mellitus without complication, without long-term current use of insulin ([place])   [place], Neoma Laming B, MD   4 years ago Uncontrolled type 2 diabetes mellitus without complication, without long-term current use of insulin ([place])   [place], [place]       Future Appointments             In 1 month [place], [place], [place] in normal range and within 360 days    Creatinine, Ser  Date Value Ref Range Status  07/14/2020 1.07 0.61 - 1.24 mg/dL Final          Passed - eGFR in normal range and within 360 days    GFR calc Af Amer  Date Value Ref Range Status  11/12/2019 93 >59 mL/min/1.73 Final    Comment:    **In accordance with recommendations from the NKF-ASN Task force,**    [facility] is in the process of updating its eGFR calculation to the   2021 CKD-EPI creatinine equation that estimates kidney function   without a race variable.    GFR, Estimated  Date Value Ref Range Status  07/14/2020 >60 >60 mL/min Final    Comment:    (NOTE) Calculated using the CKD-EPI Creatinine Equation (2021)

## 2020-08-13 ENCOUNTER — Other Ambulatory Visit: Payer: Self-pay

## 2020-08-13 MED ORDER — METFORMIN HCL 1000 MG PO TABS: 1000.0000 mg | ORAL_TABLET | Freq: Two times a day (BID) | ORAL | 3 refills | Status: DC

## 2020-09-17 ENCOUNTER — Ambulatory Visit: Payer: Self-pay

## 2020-10-13 ENCOUNTER — Other Ambulatory Visit: Payer: Self-pay | Admitting: Internal Medicine

## 2020-10-14 ENCOUNTER — Telehealth: Payer: Self-pay

## 2020-10-14 LAB — HGB A1C W/O EAG: Hgb A1c MFr Bld: 8.9 % — ABNORMAL HIGH (ref 4.8–5.6)

## 2020-10-14 NOTE — Telephone Encounter (Signed)
.  Todd Barnes

## 2020-10-15 ENCOUNTER — Other Ambulatory Visit: Payer: Self-pay

## 2020-10-15 ENCOUNTER — Encounter: Payer: Self-pay | Admitting: Internal Medicine

## 2020-10-15 ENCOUNTER — Ambulatory Visit: Payer: Self-pay | Admitting: Internal Medicine

## 2020-10-15 DIAGNOSIS — E781 Pure hyperglyceridemia: Secondary | ICD-10-CM

## 2020-10-15 DIAGNOSIS — E785 Hyperlipidemia, unspecified: Secondary | ICD-10-CM

## 2020-10-15 DIAGNOSIS — E119 Type 2 diabetes mellitus without complications: Secondary | ICD-10-CM

## 2020-10-15 MED ORDER — ATENOLOL 50 MG PO TABS: ORAL_TABLET | Freq: Every day | ORAL | 3 refills | Status: DC

## 2020-10-15 MED ORDER — LOVASTATIN 40 MG PO TABS: 40.0000 mg | ORAL_TABLET | Freq: Every day | ORAL | 3 refills | Status: DC

## 2020-10-15 MED ORDER — GLIMEPIRIDE 4 MG PO TABS: 4.0000 mg | ORAL_TABLET | Freq: Two times a day (BID) | ORAL | 3 refills | Status: AC

## 2020-10-15 MED ORDER — METFORMIN HCL 1000 MG PO TABS: 1000.0000 mg | ORAL_TABLET | Freq: Two times a day (BID) | ORAL | 3 refills | Status: AC

## 2020-10-15 MED ORDER — FENOFIBRATE 145 MG PO TABS: ORAL_TABLET | Freq: Every day | ORAL | 2 refills | Status: DC

## 2020-10-15 NOTE — Progress Notes (Signed)
Internal MEDICINE  Office Visit Note  Patient Name: Todd Barnes  [id number]  [id number]  Date of Service: 10/15/2020  Chief Complaint  Patient presents with   Follow-up    HPI  Pt is here for routine follow up.   DM type  2 F/u- AIC 8.9 on 10/13/20 HTN- BP control HLP Current Medication: Outpatient Encounter Medications as of 10/15/2020  Medication Sig   acetaminophen (TYLENOL) 500 MG tablet Take 1 tablet (500 mg total) by mouth every 6 (six) hours as needed for mild pain, moderate pain, fever or headache.   aspirin 81 MG chewable tablet CHEW 1 TABLET BY MOUTH EVERY DAY (Patient taking differently: Chew 81 mg by mouth daily.)   atenolol (TENORMIN) 50 MG tablet TAKE ONE TABLET BY MOUTH EVERY DAY (Patient taking differently: Take 50 mg by mouth daily.)   docusate sodium (COLACE) 100 MG capsule Take 2 capsules (200 mg total) by mouth daily as needed for mild constipation.   fenofibrate (TRICOR) 145 MG tablet TAKE ONE TABLET BY MOUTH EVERY DAY (Patient taking differently: Take 145 mg by mouth daily.)   glimepiride (AMARYL) 4 MG tablet Take 1 tablet (4 mg total) by mouth 2 (two) times daily.   ibuprofen (ADVIL) 600 MG tablet Take 1 tablet (600 mg total) by mouth every 6 (six) hours as needed for mild pain or moderate pain (try tyelenol first).   lisinopril-hydrochlorothiazide (ZESTORETIC) 20-12.5 MG tablet 1 tab po bid (Patient taking differently: Take 1 tablet by mouth 2 (two) times daily.)   lovastatin (MEVACOR) 20 MG tablet Take 1 tablet (20 mg total) by mouth at bedtime.   metFORMIN (GLUCOPHAGE) 1000 MG tablet Take 1 tablet (1,000 mg total) by mouth 2 (two) times daily with a meal.   No facility-administered encounter medications on file as of 10/15/2020.    Surgical History: Past Surgical History:  Procedure Laterality Date   LAPAROSCOPIC APPENDECTOMY N/A 07/13/2020   Procedure: APPENDECTOMY LAPAROSCOPIC;  Surgeon: Andria Meuse, MD;  Location: [facility] OR;   Service: General;  Laterality: N/A;    Medical History: Past Medical History:  Diagnosis Date   Diabetes mellitus without complication (HCC)    Hyperlipidemia    Hypertension     Family History: Family History  Problem Relation Age of Onset   Diabetes Mother     Social History   Socioeconomic History   Marital status: Married    Spouse name: Not on file   Number of children: Not on file   Years of education: 16   Highest education level: Not on file  Occupational History   Occupation: house keeping  Tobacco Use   Smoking status: Never   Smokeless tobacco: Never  Vaping Use   Vaping Use: Never used  Substance and Sexual Activity   Alcohol use: No   Drug use: No   Sexual activity: Never  Other Topics Concern   Not on file  Social History Narrative   Not on file   Social Determinants of Health   Financial Resource Strain: Not on file  Food Insecurity: Not on file  Transportation Needs: Not on file  Physical Activity: Not on file  Stress: Not on file  Social Connections: Not on file  Intimate Partner Violence: Not on file      Review of Systems  Vital Signs: BP 136/77 (BP Location: Right Arm, Patient Position: Sitting, Cuff Size: Normal)   Pulse 73   Temp 97.8 F (36.6 C) (Oral)   Ht 5' 8.9" (  1.75 m)   Wt 170 lb 12.8 oz (77.5 kg)   SpO2 98%   BMI 25.30 kg/m    Physical Exam     Assessment/Plan: DM-   was on janumert- it was working-  now  Keep metformin- add januvia 100 mg daily  HTN ok on med RF meds F/u 3 month after labs  General Counseling: Todd Barnes verbalizes understanding of the findings of todays visit and agrees with plan of treatment. I have discussed any further diagnostic evaluation that may be needed or ordered today. We also reviewed his medications today. he has been encouraged to call the office with any questions or concerns that should arise related to todays visit.    No orders of the defined types were placed in this  encounter.   No orders of the defined types were placed in this encounter.       Georgann Housekeeper

## 2020-11-14 ENCOUNTER — Other Ambulatory Visit (HOSPITAL_COMMUNITY): Payer: Self-pay

## 2021-01-21 ENCOUNTER — Ambulatory Visit: Payer: Self-pay | Admitting: Internal Medicine

## 2021-02-04 ENCOUNTER — Ambulatory Visit: Payer: Self-pay | Admitting: Family Medicine

## 2021-06-02 ENCOUNTER — Other Ambulatory Visit: Payer: Self-pay

## 2021-06-02 DIAGNOSIS — E781 Pure hyperglyceridemia: Secondary | ICD-10-CM

## 2021-06-02 MED ORDER — FENOFIBRATE 145 MG PO TABS: ORAL_TABLET | Freq: Every day | ORAL | 2 refills | Status: AC

## 2021-08-26 ENCOUNTER — Other Ambulatory Visit: Payer: Self-pay

## 2021-08-26 MED ORDER — ATENOLOL 50 MG PO TABS: 50.0000 mg | ORAL_TABLET | Freq: Every day | ORAL | 3 refills | Status: AC

## 2022-10-06 IMAGING — CT CT ABD-PELV W/O CM
2 of 4 series · 16 of 46 positions shown, 18 images · non-contrast
Comparison: None.

CLINICAL DATA: Abdominal pain for 2 days.  Appendicitis suspected.

EXAM:
CT ABDOMEN AND PELVIS WITHOUT CONTRAST
TECHNIQUE: Multidetector CT imaging of the abdomen and pelvis was performed
following the standard protocol without IV contrast.

[Series 3: ap without · axial · non-contrast · 0.78mm/px · z∈[+762,+1177]mm · 13 of 93 slices shown, 15 images]
[im 5/93  soft-tissue]
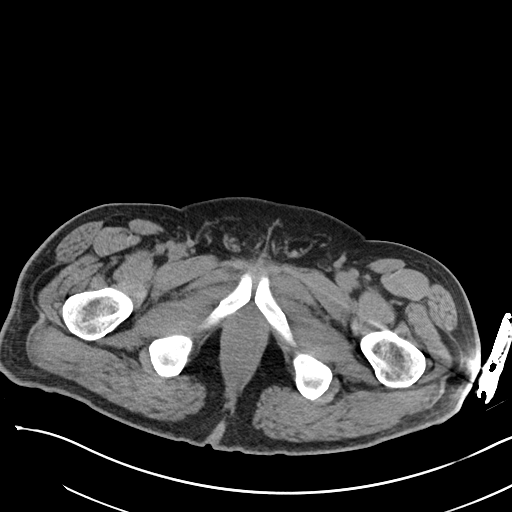
[im 5/93  bone]
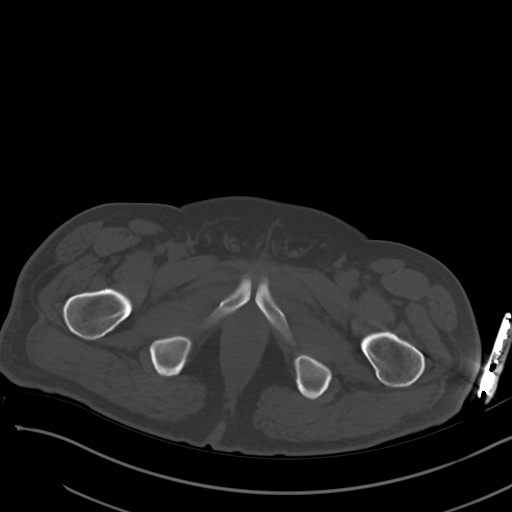
[im 14/93  soft-tissue]
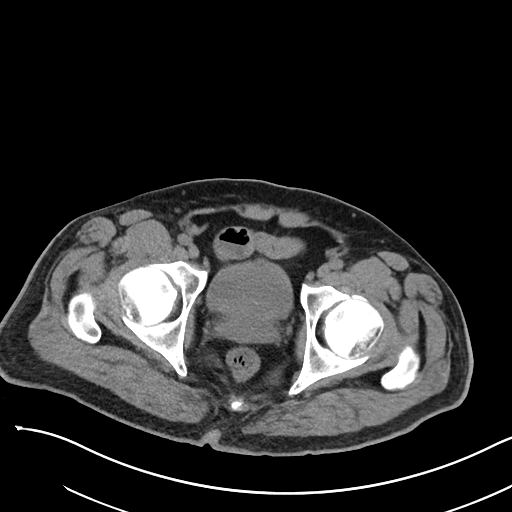
[im 18/93  soft-tissue]
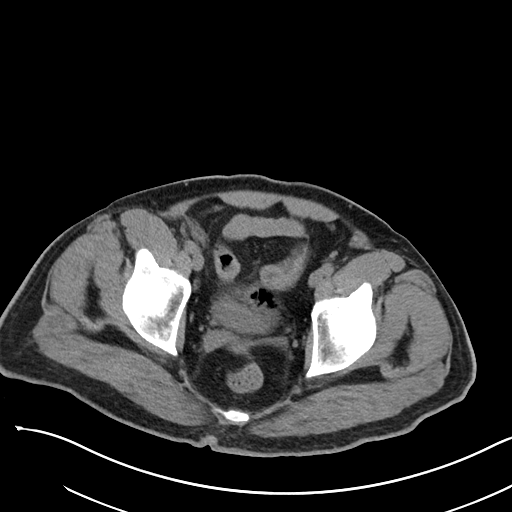
[im 27/93  soft-tissue]
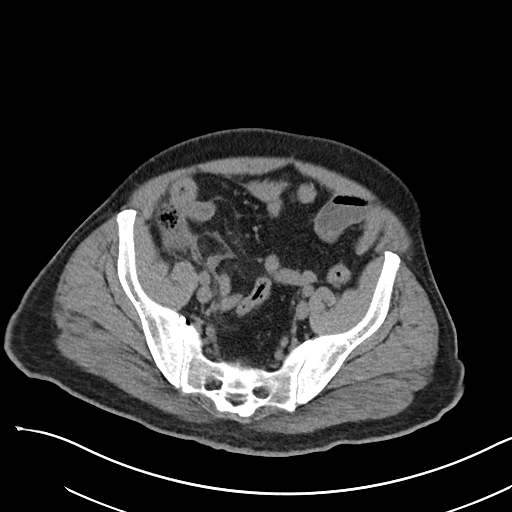
[im 31/93  soft-tissue]
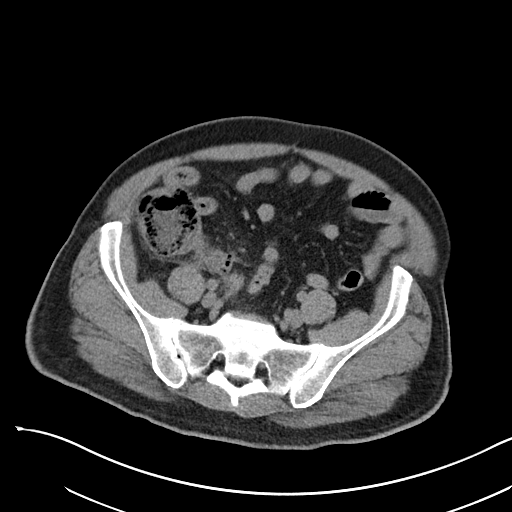
[im 40/93  soft-tissue]
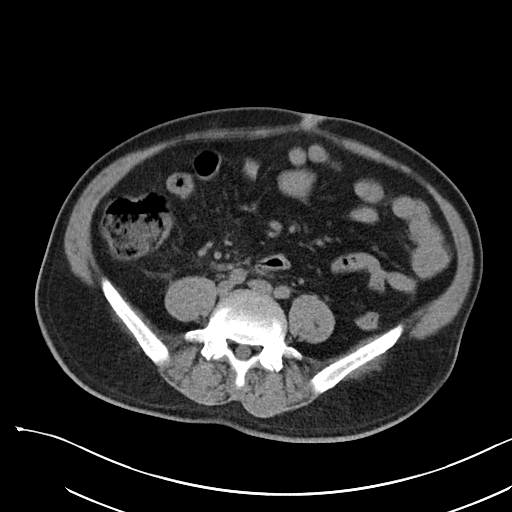
[im 49/93  soft-tissue]
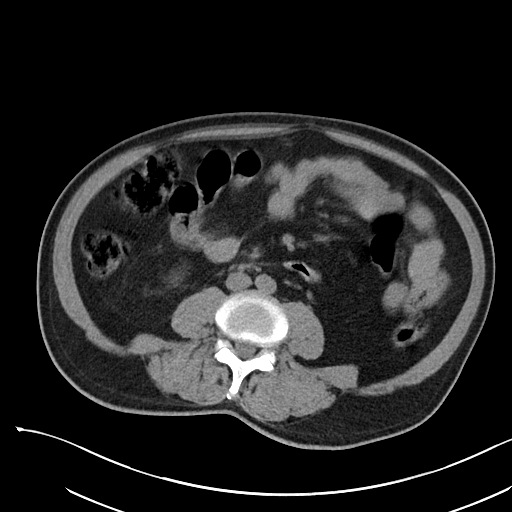
[im 53/93  soft-tissue]
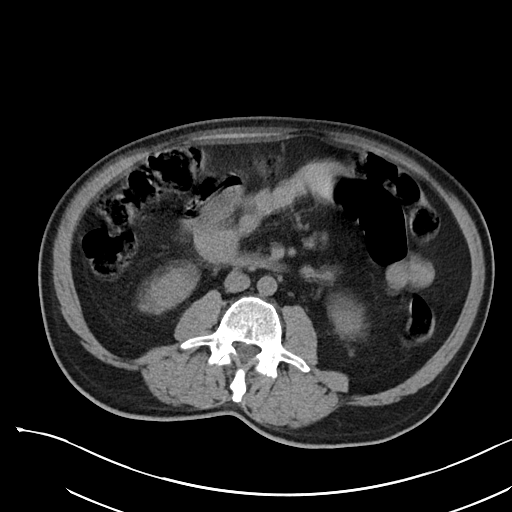
[im 62/93  soft-tissue]
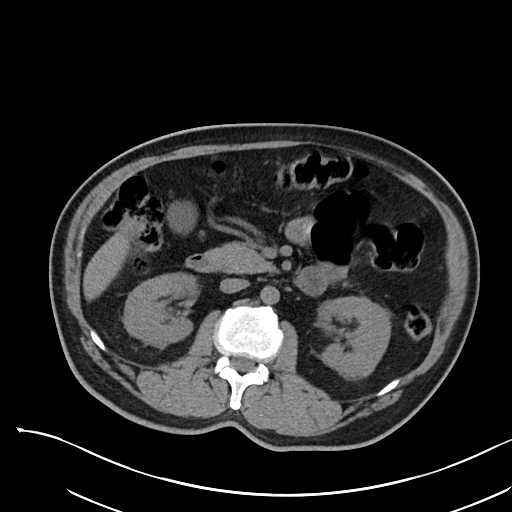
[im 62/93  bone]
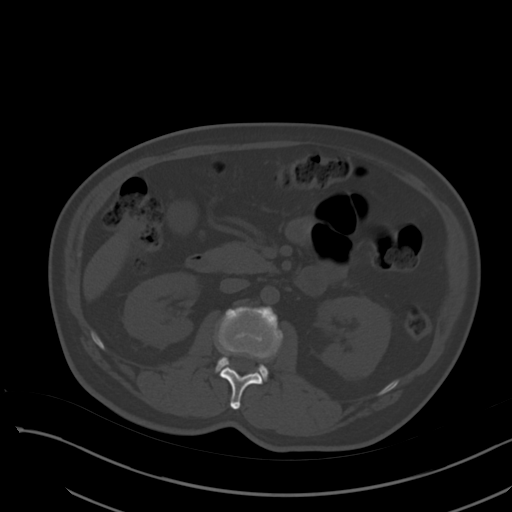
[im 66/93  soft-tissue]
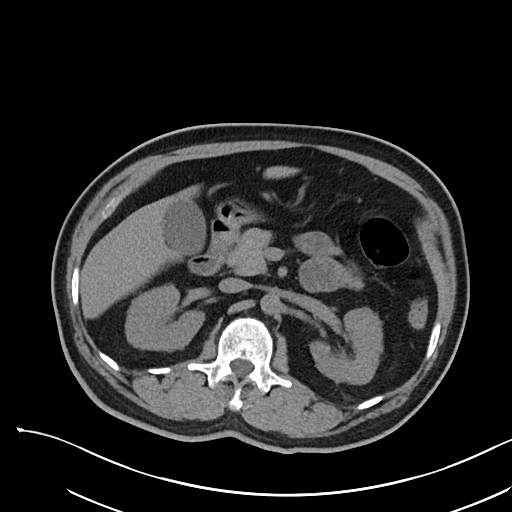
[im 75/93  soft-tissue]
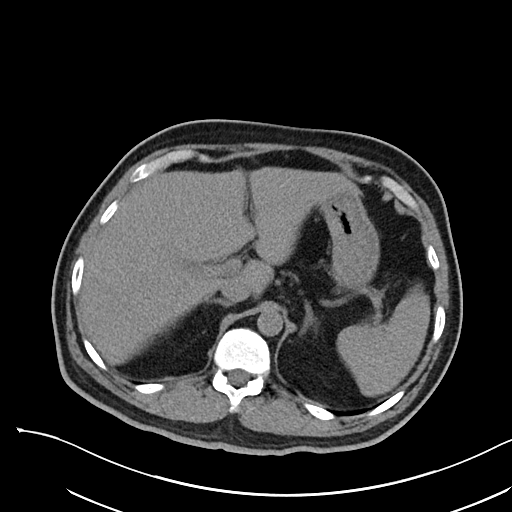
[im 79/93  soft-tissue]
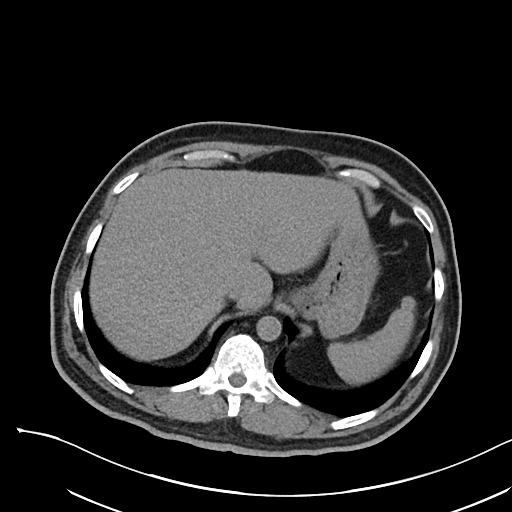
[im 88/93  soft-tissue]
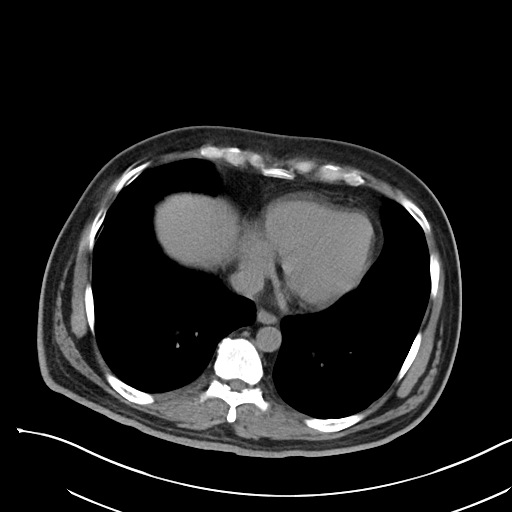

[Series 6: cor · coronal · 0.79mm/px · 3 of 97 slices shown]
[im 33/97  soft-tissue]
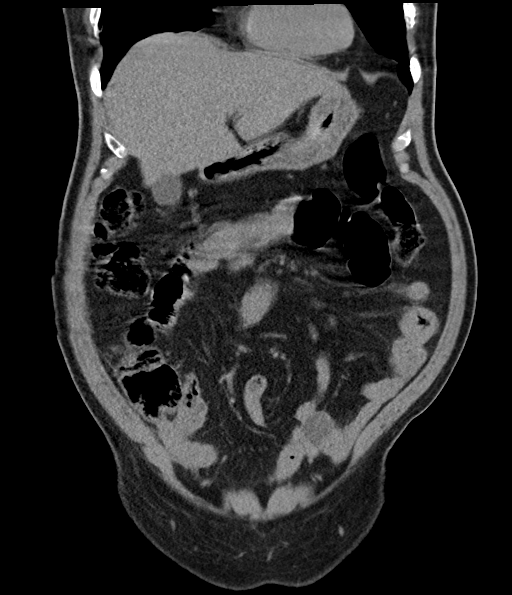
[im 43/97  soft-tissue]
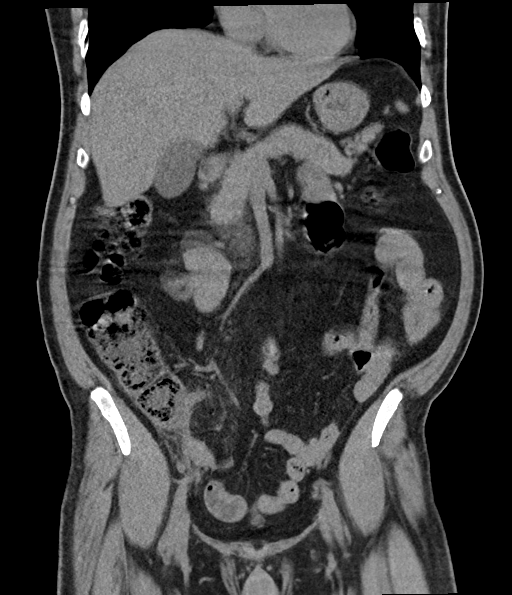
[im 54/97  soft-tissue]
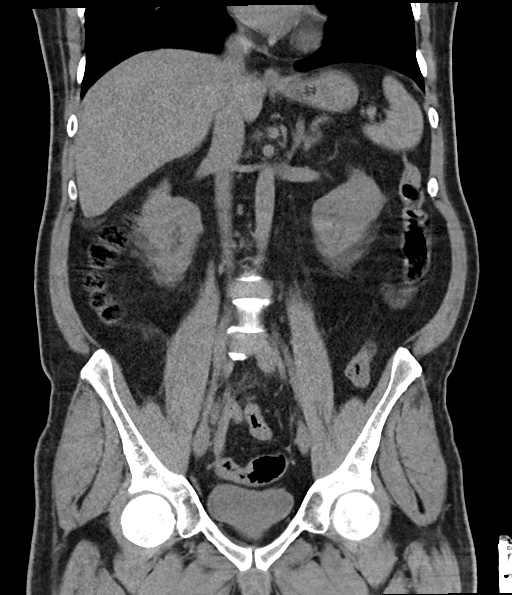

[16 of 46 positions shown; findings below may reference images not displayed]

FINDINGS: Lower chest: Motion degradation. Mild bibasilar scarring. Normal
heart size without pericardial or pleural effusion.

Hepatobiliary: Motion degradation continuing into the upper abdomen.
Mild hepatic steatosis. Grossly normal appearance of the
gallbladder, biliary tract.

Pancreas: Normal, without mass or ductal dilatation.

Spleen: Normal in size, without focal abnormality.

Adrenals/Urinary Tract: Normal adrenal glands. No gross renal
calculi or hydronephrosis. No hydroureter or ureteric calculi. No
bladder calculi.

Stomach/Bowel: Normal stomach, without wall thickening. Normal colon
and terminal ileum. Right lower quadrant inflammation is likely
centered around the appendix, suboptimally evaluated secondary to
nondedicated technique. Example cylindrical structure of 11 mm on
coronal image 46 with adjacent edema and likely extraluminal gas on
coronal image 47 and transverse image 63.

Normal small bowel.

Vascular/Lymphatic: Normal caliber of the aorta and branch vessels.
No abdominopelvic adenopathy.

Reproductive: Mild prostatomegaly.

Other: Trace pelvic fluid.  No gross free perforation.

Musculoskeletal: No acute osseous abnormality.
IMPRESSION: 1. Multifactorial degradation, including motion and nondedicated
technique (lack of oral or IV contrast).
2. Right lower quadrant inflammation, favored to be centered about
the appendix. Surrounding edema and extraluminal gas suggest
microperforation. No abscess or free extraluminal gas.
3. Mild hepatic steatosis.
4. Mild prostatomegaly.

A call to the clinical service is pending.

## 2023-03-28 ENCOUNTER — Other Ambulatory Visit: Payer: Self-pay | Admitting: Urology

## 2023-04-06 NOTE — Patient Instructions (Signed)
 SURGICAL WAITING ROOM VISITATION Patients having surgery or a procedure may have no more than 2 support people in the waiting area - these visitors may rotate in the visitor waiting room.   If the patient needs to stay at the hospital during part of their recovery, the visitor guidelines for inpatient rooms apply.  PRE-OP VISITATION  Pre-op nurse will coordinate an appropriate time for 1 support person to accompany the patient in pre-op.  This support person may not rotate.  This visitor will be contacted when the time is appropriate for the visitor to come back in the pre-op area.  Please refer to the [facility] website for the visitor guidelines for Inpatients (after your surgery is over and you are in a regular room).  You are not required to quarantine at this time prior to your surgery. However, you must do this: Hand Hygiene often Do NOT share personal items Notify your provider if you are in close contact with someone who has COVID or you develop fever 100.4 or greater, new onset of sneezing, cough, sore throat, shortness of breath or body aches.  If you test positive for Covid or have been in contact with anyone that has tested positive in the last 10 days please notify you surgeon.    Your procedure is scheduled on:  TUESDAY  April 09, 2023  Report to [facility] Main Entrance: [facility] entrance where the [employer] is available.   Report to admitting at: 11:15    AM  Call this number if you have any questions or problems the morning of surgery [phone]  DO NOT EAT OR DRINK ANYTHING AFTER MIDNIGHT THE NIGHT PRIOR TO YOUR SURGERY / PROCEDURE.   FOLLOW  ANY ADDITIONAL PRE OP INSTRUCTIONS YOU RECEIVED FROM YOUR SURGEON'S OFFICE!!!   Oral Hygiene is also important to reduce your risk of infection.        Remember - BRUSH YOUR TEETH THE MORNING OF SURGERY WITH YOUR REGULAR TOOTHPASTE  Do NOT smoke after Midnight the night before surgery.  STOP TAKING all Vitamins,  Herbs and supplements 1 week before your surgery.   Take ONLY these medicines the morning of surgery with A SIP OF WATER: Atenolol, You may take Tylenol if needed.   ????                   You may not have any metal on your body including jewelry, and body piercing  Do not wear lotions, powders, cologne, or deodorant  Men may shave face and neck.  Contacts, Hearing Aids, dentures or bridgework may not be worn into surgery. DENTURES WILL BE REMOVED PRIOR TO SURGERY PLEASE DO NOT APPLY "Poly grip" OR ADHESIVES!!!  You may bring a small overnight bag with you on the day of surgery, only pack items that are not valuable. [facility] IS NOT RESPONSIBLE   FOR VALUABLES THAT ARE LOST OR STOLEN.   Patients discharged on the day of surgery will not be allowed to drive home.  Someone NEEDS to stay with you for the first 24 hours after anesthesia.  Do not bring your home medications to the hospital. The Pharmacy will dispense medications listed on your medication list to you during your admission in the Hospital.  Please read over the following fact sheets you were given: IF YOU HAVE QUESTIONS ABOUT YOUR PRE-OP INSTRUCTIONS, PLEASE CALL [phone].   [facility] - Preparing for Surgery Before surgery, you can play an important role.  Because skin  is not sterile, your skin needs to be as free of germs as possible.  You can reduce the number of germs on your skin by washing with CHG (chlorahexidine gluconate) soap before surgery.  CHG is an antiseptic cleaner which kills germs and bonds with the skin to continue killing germs even after washing. Please DO NOT use if you have an allergy to CHG or antibacterial soaps.  If your skin becomes reddened/irritated stop using the CHG and inform your nurse when you arrive at Short Stay. Do not shave (including legs and underarms) for at least 48 hours prior to the first CHG shower.  You may shave your face/neck.  Please follow these instructions  carefully:  1.  Shower with CHG Soap the night before surgery and the  morning of surgery.  2.  If you choose to wash your hair, wash your hair first as usual with your normal  shampoo.  3.  After you shampoo, rinse your hair and body thoroughly to remove the shampoo.                             4.  Use CHG as you would any other liquid soap.  You can apply chg directly to the skin and wash.  Gently with a scrungie or clean washcloth.  5.  Apply the CHG Soap to your body ONLY FROM THE NECK DOWN.   Do not use on face/ open                           Wound or open sores. Avoid contact with eyes, ears mouth and genitals (private parts).                       Wash face,  Genitals (private parts) with your normal soap.             6.  Wash thoroughly, paying special attention to the area where your  surgery  will be performed.  7.  Thoroughly rinse your body with warm water from the neck down.  8.  DO NOT shower/wash with your normal soap after using and rinsing off the CHG Soap.            9.  Pat yourself dry with a clean towel.            10.  Wear clean pajamas.            11.  Place clean sheets on your bed the night of your first shower and do not  sleep with pets.  ON THE DAY OF SURGERY : Do not apply any lotions/deodorants the morning of surgery.  Please wear clean clothes to the hospital/surgery center.    FAILURE TO FOLLOW THESE INSTRUCTIONS MAY RESULT IN THE CANCELLATION OF YOUR SURGERY  PATIENT SIGNATURE_________________________________  NURSE SIGNATURE__________________________________  ________________________________________________________________________

## 2023-04-06 NOTE — Progress Notes (Signed)
 COVID Vaccine received:  []  No [x]  Yes Date of any COVID positive Test in last 90 days:  None  PCP -  Knox Royalty, MD,  [place] at [facility] [phone]   fax) [phone] Cardiologist -  none   Chest x-ray - 12-05-2018  1v  Epic EKG -  07-13-2020 Epic  will repeat Stress Test -  ECHO -  Cardiac Cath -   PCR screen: []  Ordered & Completed []   No Order but Needs PROFEND     [x]   N/A for this surgery  Surgery Plan:  [x]  Ambulatory   []  Outpatient in bed  []  Admit Anesthesia:    [x]  General  []  Spinal  []   Choice []   MAC  Bowel Prep - [x]  No  []   Yes ______  Pacemaker / ICD device [x]  No []  Yes   Spinal Cord Stimulator:[x]  No []  Yes       History of Sleep Apnea? [x]  No []  Yes   CPAP used?- [x]  No []  Yes    Does the patient monitor blood sugar?   []  N/A   []  No [x]  Yes  Patient has: []  NO Hx DM   []  Pre-DM   []  DM1  [x]   DM2 Last A1c was: 8.9 on   10-13-2020   Does patient have a [employer] or Dexcom? []  No []  Yes   Fasting Blood Sugar Ranges-  Checks Blood Sugar _2  times a week METFORMIN- 1000mg  bid   GLIMEPIRIDE- 4 mg bid     Blood Thinner / Instructions:  none Aspirin Instructions:  ASA 81 mg  already stopped 5 days ago  ERAS Protocol Ordered: [x]  No  []  Yes Patient is to be NPO after: midnight prior  Dental hx: []  Dentures:  [x]  N/A      []  Bridge or Partial:                   []  Loose or Damaged teeth:   Comments: No H&P or notes available since 2022 in either EPIC or Care Everywhere. I checked for a double chart but there was none. Dois Davenport is going to fax his H&P to me asap, Patient goes to [facility] at [address], sees Dr. Knox Royalty.   Activity level: Patient is able to climb a flight of stairs without difficulty; [x]  No CP  [x]  No SOB Patient can perform ADLs without assistance.   Anesthesia review: DM2 (CBG at PST was 282, he ate a large breakfast this morning at 0730, usual 120-150), HTN  Patient denies shortness of breath, fever, cough and  chest pain at PAT appointment.  Patient verbalized understanding and agreement to the Pre-Surgical Instructions that were given to them at this PAT appointment. Patient was also educated of the need to review these PAT instructions again prior to his surgery.I reviewed the appropriate phone numbers to call if they have any and questions or concerns.

## 2023-04-08 ENCOUNTER — Other Ambulatory Visit: Payer: Self-pay

## 2023-04-08 ENCOUNTER — Encounter (HOSPITAL_COMMUNITY): Payer: Self-pay

## 2023-04-08 ENCOUNTER — Encounter (HOSPITAL_COMMUNITY)
Admission: RE | Admit: 2023-04-08 | Discharge: 2023-04-08 | Disposition: A | Discharge status: Home / Self Care | Source: Ambulatory Visit | Attending: Urology | Admitting: Urology

## 2023-04-08 VITALS — BP 121/68 | HR 78 | Temp 98.7°F | Resp 14 | Ht 68.0 in | Wt 177.0 lb

## 2023-04-08 DIAGNOSIS — I10 Essential (primary) hypertension: Secondary | ICD-10-CM | POA: Diagnosis not present

## 2023-04-08 DIAGNOSIS — Z01818 Encounter for other preprocedural examination: Secondary | ICD-10-CM | POA: Insufficient documentation

## 2023-04-08 DIAGNOSIS — E1165 Type 2 diabetes mellitus with hyperglycemia: Secondary | ICD-10-CM | POA: Insufficient documentation

## 2023-04-08 LAB — BASIC METABOLIC PANEL WITH GFR
Anion gap: 10 (ref 5–15)
BUN: 36 mg/dL — ABNORMAL HIGH (ref 6–20)
CO2: 24 mmol/L (ref 22–32)
Calcium: 9.4 mg/dL (ref 8.9–10.3)
Chloride: 101 mmol/L (ref 98–111)
Creatinine, Ser: 1.34 mg/dL — ABNORMAL HIGH (ref 0.61–1.24)
GFR, Estimated: >60 mL/min (ref >60)
Glucose, Bld: 295 mg/dL — ABNORMAL HIGH (ref 70–99)
Potassium: 4.1 mmol/L (ref 3.5–5.1)
Sodium: 135 mmol/L (ref 135–145)

## 2023-04-08 LAB — CBC
HCT: 40.7 % (ref 39.0–52.0)
Hemoglobin: 13.1 g/dL (ref 13.0–17.0)
MCH: 26.8 pg (ref 26.0–34.0)
MCHC: 32.2 g/dL (ref 30.0–36.0)
MCV: 83.4 fL (ref 80.0–100.0)
Platelets: 293 10*3/uL (ref 150–400)
RBC: 4.88 MIL/uL (ref 4.22–5.81)
RDW: 12.2 % (ref 11.5–15.5)
WBC: 5.5 10*3/uL (ref 4.0–10.5)
nRBC: 0 % (ref 0.0–0.2)

## 2023-04-08 LAB — GLUCOSE, CAPILLARY: Glucose-Capillary: 282 mg/dL — ABNORMAL HIGH (ref 70–99)

## 2023-04-08 LAB — HEMOGLOBIN A1C
Hgb A1c MFr Bld: 8.5 % — ABNORMAL HIGH (ref 4.8–5.6)
Mean Plasma Glucose: 197 mg/dL

## 2023-04-08 NOTE — Anesthesia Preprocedure Evaluation (Signed)
 Anesthesia Evaluation  Patient identified by MRN, date of birth, ID band Patient awake    Reviewed: Allergy & Precautions, NPO status , Patient's Chart, lab work & pertinent test results, reviewed documented beta blocker date and time   Airway Mallampati: IV  TM Distance: >3 FB Neck ROM: Full    Dental  (+) Teeth Intact, Dental Advisory Given   Pulmonary neg pulmonary ROS   Pulmonary exam normal breath sounds clear to auscultation       Cardiovascular hypertension (120/77 preop), Pt. on medications and Pt. on home beta blockers Normal cardiovascular exam Rhythm:Regular Rate:Normal     Neuro/Psych negative neurological ROS  negative psych ROS   GI/Hepatic negative GI ROS, Neg liver ROS,,,  Endo/Other  diabetes, Well Controlled, Type 2, Oral Hypoglycemic Agents    Renal/GU Renal InsufficiencyRenal diseaseCr 1.34 Bladder dysfunction (bladder tumor)      Musculoskeletal negative musculoskeletal ROS (+)    Abdominal   Peds  Hematology negative hematology ROS (+)   Anesthesia Other Findings   Reproductive/Obstetrics negative OB ROS                             Anesthesia Physical Anesthesia Plan  ASA: 2  Anesthesia Plan: General   Post-op Pain Management: Tylenol PO (pre-op)*   Induction: Intravenous  PONV Risk Score and Plan: 3 and Ondansetron, Dexamethasone, Midazolam and Treatment may vary due to age or medical condition  Airway Management Planned: Oral ETT and Video Laryngoscope Planned  Additional Equipment: None  Intra-op Plan:   Post-operative Plan: Extubation in OR  Informed Consent: I have reviewed the patients History and Physical, chart, labs and discussed the procedure including the risks, benefits and alternatives for the proposed anesthesia with the patient or authorized representative who has indicated his/her understanding and acceptance.     Dental advisory  given  Plan Discussed with: CRNA  Anesthesia Plan Comments:        Anesthesia Quick Evaluation

## 2023-04-08 NOTE — H&P (Signed)
 CC/HPI: cc: gross hematuria   02/04/23: 63 year old man referred for gross hematuria. This occurred in September 2024. Subsequent renal ultrasound in October 2024 was done by PCP and report states no stones, masses or hydronephrosis. Urinalysis today shows 20-40 RBCs per high-powered field. Patient was also started on daily tadalafil which has significantly helped nocturia. Patient was offered interpreter services today but declined.   IPSS: 0, 2, 0, 0, 0, 0, 3= 5/35  4/6 mostly dissatisfied quality of life   03/18/2023: 63 year old man who presented with gross hematuria found to have bladder tumor on CT. He is here today for cystoscopy.     ALLERGIES: No Known Allergies    MEDICATIONS: Hydrochlorothiazide 25 mg tablet  Lisinopril 20 mg tablet  Metformin Hcl 1,000 mg tablet  Atenolol 50 mg tablet  Atenolol 50 mg tablet  Fenofibrate 145 mg tablet  Glimepiride  Januvia 100 mg tablet  Lovastatin 10 mg tablet  Tadalafil 5 mg tablet     GU PSH: Locm 300-399Mg /Ml Iodine,1Ml - 02/27/2023     NON-GU PSH: Appendectomy     GU PMH: Gross hematuria - 02/27/2023, - 02/04/2023 BPH w/LUTS - 02/04/2023 Nocturia - 02/04/2023 Urinary Frequency - 02/04/2023    NON-GU PMH: Diabetes Type 2 Hypercholesterolemia Hypertension    FAMILY HISTORY: 2 daughters - Runs in Family 2 sons - Runs in Family   SOCIAL HISTORY: Marital Status: Married Preferred Language: English; Ethnicity: Not Hispanic Or Latino; Race: Other Race Current Smoking Status: Patient has never smoked.   Tobacco Use Assessment Completed: Used Tobacco in last 30 days? Does not drink anymore.  Drinks 1 caffeinated drink per day.    REVIEW OF SYSTEMS:    GU Review Male:   Patient denies trouble starting your stream, stream starts and stops, hard to postpone urination, penile pain, frequent urination, have to strain to urinate , burning/ pain with urination, get up at night to urinate, erection problems, and leakage of urine.   Gastrointestinal (Upper):   Patient denies nausea, vomiting, and indigestion/ heartburn.  Gastrointestinal (Lower):   Patient denies diarrhea and constipation.  Constitutional:   Patient denies fever, night sweats, weight loss, and fatigue.  Skin:   Patient denies skin rash/ lesion and itching.  Eyes:   Patient denies blurred vision and double vision.  Ears/ Nose/ Throat:   Patient denies sore throat and sinus problems.  Hematologic/Lymphatic:   Patient denies swollen glands and easy bruising.  Cardiovascular:   Patient denies leg swelling and chest pains.  Respiratory:   Patient denies cough and shortness of breath.  Endocrine:   Patient denies excessive thirst.  Musculoskeletal:   Patient denies back pain and joint pain.  Neurological:   Patient denies headaches and dizziness.  Psychologic:   Patient denies depression and anxiety.   VITAL SIGNS: None   MULTI-SYSTEM PHYSICAL EXAMINATION:    Constitutional: Well-nourished. No physical deformities. Normally developed. Good grooming.  Neck: Neck symmetrical, not swollen. Normal tracheal position.  Respiratory: No labored breathing, no use of accessory muscles.   Skin: No paleness, no jaundice, no cyanosis. No lesion, no ulcer, no rash.  Neurologic / Psychiatric: Oriented to time, oriented to place, oriented to person. No depression, no anxiety, no agitation.  Eyes: Normal conjunctivae. Normal eyelids.  Ears, Nose, Mouth, and Throat: Left ear no scars, no lesions, no masses. Right ear no scars, no lesions, no masses. Nose no scars, no lesions, no masses. Normal hearing. Normal lips.  Musculoskeletal: Normal gait and station of head and neck.  Complexity of Data:  Records Review:   Previous Patient Records, POC Tool  Urine Test Review:   Urinalysis  X-Ray Review: C.T. Abdomen/Pelvis: Reviewed Films. Reviewed Report. Discussed With Patient. IMPRESSION:   1. 1.2 cm enhancing nodule within the anterior left bladder,  consistent with  urothelial carcinoma.  2. No evidence of metastatic disease.  3. 5 mm pancreatic head cystic lesion could represent a pseudocyst  or indolent cystic neoplasm. Per consensus criteria, this warrants  follow-up with pre and post contrast abdominal MRI/MRCP at 1 year.  This recommendation follows ACR consensus guidelines: Management of  Incidental Pancreatic Cysts: A White Paper of the ACR Incidental  Findings Committee. J Am Coll Radiol 2017;14:911-923.  4. 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  5. Incidental findings, including: Aortic Atherosclerosis  (ICD10-I70.0). Prostatomegaly.    Electronically Signed  By: Jeronimo Greaves M.D.  On: 03/14/2023 15:19     02/04/23  PSA  Total PSA 2.45 ng/mL    PROCEDURES:         Flexible Cystoscopy - 52000  Risks, benefits, and some of the potential complications of the procedure were discussed at length with the patient including infection, bleeding, voiding discomfort, urinary retention, fever, chills, sepsis, and others. All questions were answered. Informed consent was obtained. Sterile technique and intraurethral analgesia were used.  Meatus:  Normal size. Normal location. Normal condition.  Urethra:  No strictures.  External Sphincter:  Normal.  Verumontanum:  Normal.  Prostate:  Obstructing lateral lobes with protrusion into bladder, tight bladder neck  Bladder Neck:  Non-obstructing.  Ureteral Orifices:  Normal location. Normal size. Normal shape. Effluxed clear urine.  Bladder:  2 cm nodular bladder tumor left lateral wall with calcification      The lower urinary tract was carefully examined. The procedure was well-tolerated and without complications. Antibiotic instructions were given. Instructions were given to call the office immediately for bloody urine, difficulty urinating, urinary retention, painful or frequent urination, fever, chills, nausea, vomiting or other illness. The patient stated that he understood these instructions and  would comply with them.         Urinalysis w/Scope - 81001 Dipstick Dipstick Cont'd Micro  Specimen: Voided Bilirubin: Neg WBC/hpf: NS (Not Seen)  Color: Yellow Ketones: Neg RBC/hpf: 0 - 2/hpf  Appearance: Clear Blood: 1+ Bacteria: NS (Not Seen)  Specific Gravity: <= 1.005 Protein: Neg Cystals: Amorph Phosphate  pH: 6.0 Urobilinogen: 0.2 Casts: NS (Not Seen)  Glucose: 3+ Nitrites: Neg Trichomonas: Not Present    Leukocyte Esterase: Neg Mucous: Not Present      Epithelial Cells: 0 - 5/hpf      Yeast: NS (Not Seen)      Sperm: Not Present    ASSESSMENT:      ICD-10 Details  1 GU:   Gross hematuria - R31.0 Undiagnosed New Problem  2   BPH w/LUTS - N40.1 Chronic, Stable  3   Urinary Frequency - R35.0 Chronic, Stable  4   Bladder Cancer Lateral - C67.2 Undiagnosed New Problem   PLAN:           Document Letter(s):  Created for Patient: Clinical Summary         Notes:   1. Bladder tumor:  -CT concerning for bladder mass which was confirmed on cystoscopy today  -Discussed that this is likely a bladder cancer and next steps including transurethral resection of bladder tumor and intravesical gemcitabine  -Risks and benefits of TURBT were discussed with the patient including but not limited to  pain, bleeding, infection, need for Foley catheter, need for additional treatment, bladder perforation, damage to surrounding structures

## 2023-04-09 ENCOUNTER — Other Ambulatory Visit: Payer: Self-pay

## 2023-04-09 ENCOUNTER — Ambulatory Visit (HOSPITAL_COMMUNITY)
Admission: RE | Admit: 2023-04-09 | Discharge: 2023-04-09 | Disposition: A | Discharge status: Home / Self Care | Attending: Urology | Admitting: Urology

## 2023-04-09 ENCOUNTER — Encounter (HOSPITAL_COMMUNITY)
Admission: RE | Disposition: A | Discharge status: Home / Self Care | Payer: Self-pay | Source: Home / Self Care | Attending: Urology

## 2023-04-09 ENCOUNTER — Encounter (HOSPITAL_COMMUNITY): Payer: Self-pay | Admitting: Urology

## 2023-04-09 ENCOUNTER — Ambulatory Visit (HOSPITAL_COMMUNITY): Payer: Self-pay | Admitting: Anesthesiology

## 2023-04-09 ENCOUNTER — Ambulatory Visit (HOSPITAL_BASED_OUTPATIENT_CLINIC_OR_DEPARTMENT_OTHER): Payer: Self-pay | Admitting: Anesthesiology

## 2023-04-09 DIAGNOSIS — Z7984 Long term (current) use of oral hypoglycemic drugs: Secondary | ICD-10-CM | POA: Insufficient documentation

## 2023-04-09 DIAGNOSIS — R310 Gross hematuria: Secondary | ICD-10-CM | POA: Insufficient documentation

## 2023-04-09 DIAGNOSIS — N401 Enlarged prostate with lower urinary tract symptoms: Secondary | ICD-10-CM | POA: Diagnosis not present

## 2023-04-09 DIAGNOSIS — C679 Malignant neoplasm of bladder, unspecified: Secondary | ICD-10-CM

## 2023-04-09 DIAGNOSIS — Z01818 Encounter for other preprocedural examination: Secondary | ICD-10-CM

## 2023-04-09 DIAGNOSIS — E1165 Type 2 diabetes mellitus with hyperglycemia: Secondary | ICD-10-CM

## 2023-04-09 DIAGNOSIS — Z79899 Other long term (current) drug therapy: Secondary | ICD-10-CM | POA: Diagnosis not present

## 2023-04-09 DIAGNOSIS — I10 Essential (primary) hypertension: Secondary | ICD-10-CM | POA: Insufficient documentation

## 2023-04-09 DIAGNOSIS — C674 Malignant neoplasm of posterior wall of bladder: Secondary | ICD-10-CM | POA: Diagnosis present

## 2023-04-09 DIAGNOSIS — E119 Type 2 diabetes mellitus without complications: Secondary | ICD-10-CM | POA: Diagnosis not present

## 2023-04-09 HISTORY — PX: TRANSURETHRAL RESECTION OF BLADDER TUMOR: SHX2575

## 2023-04-09 LAB — GLUCOSE, CAPILLARY
Glucose-Capillary: 148 mg/dL — ABNORMAL HIGH (ref 70–99)
Glucose-Capillary: 107 mg/dL — ABNORMAL HIGH (ref 70–99)

## 2023-04-09 SURGERY — TURBT (TRANSURETHRAL RESECTION OF BLADDER TUMOR)
Anesthesia: General | Site: Bladder

## 2023-04-09 MED ORDER — LIDOCAINE HCL (CARDIAC) PF 100 MG/5ML IV SOSY
PREFILLED_SYRINGE | INTRAVENOUS | Status: DC | PRN
  Administered 2023-04-09: 50 mg via INTRAVENOUS

## 2023-04-09 MED ORDER — OXYBUTYNIN CHLORIDE 5 MG PO TABS
ORAL_TABLET | ORAL | Status: AC
  Filled 2023-04-09: qty 1

## 2023-04-09 MED ORDER — ONDANSETRON HCL 4 MG/2ML IJ SOLN: 4.0000 mg | Freq: Once | INTRAMUSCULAR | Status: DC | PRN

## 2023-04-09 MED ORDER — TRAMADOL HCL 50 MG PO TABS: 50.0000 mg | ORAL_TABLET | Freq: Four times a day (QID) | ORAL | 0 refills | Status: AC | PRN

## 2023-04-09 MED ORDER — ROCURONIUM BROMIDE 100 MG/10ML IV SOLN
INTRAVENOUS | Status: DC | PRN
  Administered 2023-04-09: 60 mg via INTRAVENOUS

## 2023-04-09 MED ORDER — SODIUM CHLORIDE 0.9 % IR SOLN
Status: DC | PRN
  Administered 2023-04-09: 3000 mL

## 2023-04-09 MED ORDER — SUGAMMADEX SODIUM 200 MG/2ML IV SOLN
INTRAVENOUS | Status: DC | PRN
  Administered 2023-04-09: 100 mg via INTRAVENOUS
  Administered 2023-04-09: 200 mg via INTRAVENOUS

## 2023-04-09 MED ORDER — OXYBUTYNIN CHLORIDE 5 MG PO TABS
5.0000 mg | ORAL_TABLET | Freq: Three times a day (TID) | ORAL | Status: DC | PRN
  Administered 2023-04-09: 5 mg via ORAL

## 2023-04-09 MED ORDER — MIDAZOLAM HCL 5 MG/5ML IJ SOLN
INTRAMUSCULAR | Status: DC | PRN
  Administered 2023-04-09 (×2): 1 mg via INTRAVENOUS

## 2023-04-09 MED ORDER — OXYCODONE HCL 5 MG/5ML PO SOLN: 5.0000 mg | Freq: Once | ORAL | Status: DC | PRN

## 2023-04-09 MED ORDER — OXYCODONE HCL 5 MG PO TABS: 5.0000 mg | ORAL_TABLET | Freq: Once | ORAL | Status: DC | PRN

## 2023-04-09 MED ORDER — GEMCITABINE CHEMO FOR BLADDER INSTILLATION 2000 MG
2000.0000 mg | Freq: Once | INTRAVENOUS | Status: AC
  Administered 2023-04-09: 2000 mg via INTRAVESICAL
  Filled 2023-04-09: qty 2000

## 2023-04-09 MED ORDER — 0.9 % SODIUM CHLORIDE (POUR BTL) OPTIME
TOPICAL | Status: DC | PRN
  Administered 2023-04-09: 1000 mL

## 2023-04-09 MED ORDER — ORAL CARE MOUTH RINSE: 15.0000 mL | Freq: Once | OROMUCOSAL | Status: AC

## 2023-04-09 MED ORDER — INSULIN ASPART 100 UNIT/ML IJ SOLN
0.0000 [IU] | INTRAMUSCULAR | Status: DC | PRN
  Administered 2023-04-09: 2 [IU] via SUBCUTANEOUS
  Filled 2023-04-09: qty 1

## 2023-04-09 MED ORDER — CEFAZOLIN SODIUM-DEXTROSE 2-4 GM/100ML-% IV SOLN
2.0000 g | INTRAVENOUS | Status: AC
  Administered 2023-04-09: 2 g via INTRAVENOUS
  Filled 2023-04-09: qty 100

## 2023-04-09 MED ORDER — PHENYLEPHRINE HCL (PRESSORS) 10 MG/ML IV SOLN
INTRAVENOUS | Status: DC | PRN
  Administered 2023-04-09: 80 ug via INTRAVENOUS
  Administered 2023-04-09: 160 ug via INTRAVENOUS
  Administered 2023-04-09 (×2): 80 ug via INTRAVENOUS

## 2023-04-09 MED ORDER — FENTANYL CITRATE (PF) 100 MCG/2ML IJ SOLN
INTRAMUSCULAR | Status: DC | PRN
  Administered 2023-04-09 (×2): 50 ug via INTRAVENOUS

## 2023-04-09 MED ORDER — ACETAMINOPHEN 500 MG PO TABS
1000.0000 mg | ORAL_TABLET | Freq: Once | ORAL | Status: AC
  Administered 2023-04-09: 1000 mg via ORAL
  Filled 2023-04-09: qty 2

## 2023-04-09 MED ORDER — PROPOFOL 10 MG/ML IV BOLUS
INTRAVENOUS | Status: DC | PRN
  Administered 2023-04-09: 150 mg via INTRAVENOUS

## 2023-04-09 MED ORDER — CHLORHEXIDINE GLUCONATE 0.12 % MT SOLN
15.0000 mL | Freq: Once | OROMUCOSAL | Status: AC
  Administered 2023-04-09: 15 mL via OROMUCOSAL

## 2023-04-09 MED ORDER — HYDROMORPHONE HCL 1 MG/ML IJ SOLN
INTRAMUSCULAR | Status: AC
  Filled 2023-04-09: qty 1

## 2023-04-09 MED ORDER — AMISULPRIDE (ANTIEMETIC) 5 MG/2ML IV SOLN: 10.0000 mg | Freq: Once | INTRAVENOUS | Status: DC | PRN

## 2023-04-09 MED ORDER — DEXAMETHASONE SODIUM PHOSPHATE 10 MG/ML IJ SOLN
INTRAMUSCULAR | Status: DC | PRN
  Administered 2023-04-09: 5 mg via INTRAVENOUS

## 2023-04-09 MED ORDER — HYDROMORPHONE HCL 1 MG/ML IJ SOLN
0.2500 mg | INTRAMUSCULAR | Status: DC | PRN
  Administered 2023-04-09: 0.5 mg via INTRAVENOUS

## 2023-04-09 MED ORDER — ONDANSETRON HCL 4 MG/2ML IJ SOLN
INTRAMUSCULAR | Status: DC | PRN
  Administered 2023-04-09: 4 mg via INTRAVENOUS

## 2023-04-09 MED ORDER — LACTATED RINGERS IV SOLN: INTRAVENOUS | Status: DC

## 2023-04-09 SURGICAL SUPPLY — 17 items
BAG URINE DRAIN 2000ML AR STRL (UROLOGICAL SUPPLIES) IMPLANT
BAG URO CATCHER STRL LF (MISCELLANEOUS) ×1 IMPLANT
CATH TIEMANN FOLEY 18FR 5CC (CATHETERS) IMPLANT
CLOTH BEACON ORANGE TIMEOUT ST (SAFETY) ×1 IMPLANT
DRAPE FOOT SWITCH (DRAPES) ×1 IMPLANT
ELECT REM PT RETURN 15FT ADLT (MISCELLANEOUS) IMPLANT
GLOVE BIO SURGEON STRL SZ 6.5 (GLOVE) ×1 IMPLANT
GOWN STRL REUS W/ TWL LRG LVL3 (GOWN DISPOSABLE) ×1 IMPLANT
KIT TURNOVER KIT A (KITS) IMPLANT
LOOP CUT BIPOLAR 24F LRG (ELECTROSURGICAL) IMPLANT
MANIFOLD NEPTUNE II (INSTRUMENTS) ×1 IMPLANT
PACK CYSTO (CUSTOM PROCEDURE TRAY) ×1 IMPLANT
PAD PREP 24X48 CUFFED NSTRL (MISCELLANEOUS) ×1 IMPLANT
SYR TOOMEY IRRIG 70ML (MISCELLANEOUS) IMPLANT
SYRINGE TOOMEY IRRIG 70ML (MISCELLANEOUS) IMPLANT
TUBING CONNECTING 10 (TUBING) ×1 IMPLANT
TUBING UROLOGY SET (TUBING) ×1 IMPLANT

## 2023-04-09 NOTE — Op Note (Signed)
 PATIENT:  Todd Barnes  PRE-OPERATIVE DIAGNOSIS: Bladder tumor  POST-OPERATIVE DIAGNOSIS: Same  PROCEDURE:  Procedure(s): 1. TRANSURETHRAL RESECTION OF BLADDER TUMOR (TURBT) (2cm.) 2. Instillation of intravesical chemotherapy (Gemcitabine)  SURGEON:  Kasandra Knudsen, MD  ANESTHESIA:   General  EBL:  Minimal  DRAINS: Urethral catheter (18 Fr. Foley)   SPECIMEN:  Bladder tumor  DISPOSITION OF SPECIMEN:  PATHOLOGY  Findings: 1.  Normal anterior urethra 2.  Lateral lobe prostatic hypertrophy 3.  Bilateral orthotopic ureteral orifices seen effluxing clear urine 4.  2 cm papillary bladder mass at posterior superior left bladder wall with calcification -superficial appearing  Indication: 63 year old man who presented with gross hematuria found to have papillary bladder mass on diagnostic cystoscopy.  Description of operation: The patient was taken to the operating room and administered general anesthesia. They were then placed on the table and moved to the dorsal lithotomy position after which the genitalia was sterilely prepped and draped. An official timeout was then performed.  The 40 French resectoscope with the 30 lens and visual obturator were then passed into the bladder under direct visualization. Urethra appeared normal. The visual obturator was then removed and the Gyrus resectoscope element with 30  lens was then inserted and the bladder was fully and systematically inspected. Ureteral orifices were noted to be in the normal anatomic positions.   The papillary bladder mass was identified the left posterior superior bladder wall.  The bipolar electrocautery loop was then used to resect this mass until no further abnormal bladder mucosa was seen.  Hemostasis was achieved with bipolar electrocautery.  Hemostasis was deemed adequate with the irrigant turned off..   Reinspection of the bladder revealed all obvious tumor had been fully resected and there was no evidence  of perforation. The Annamarie Major was then used to irrigate the bladder and remove all of the portions of bladder tumor which were sent to pathology. I then removed the resectoscope.  A 18 French Foley catheter was then inserted in the bladder and irrigated. The irrigant returned slightly pink with no clots. The patient was awakened and taken to the recovery room.  While in the recovery room 2 g of gemcitabine in 52.6 cc of sterile water was instilled in the bladder through the catheter and the catheter was plugged. This will remain indwelling for approximately one hour. It will then be drained from the bladder and the catheter will be removed and the patient discharged home.  PLAN OF CARE: Discharge to home after PACU  PATIENT DISPOSITION:  PACU - hemodynamically stable.

## 2023-04-09 NOTE — Discharge Instructions (Signed)
Post Bladder Surgery Instructions   General instructions:     Your recent bladder surgery requires very little post hospital care but some definite precautions.  Despite the fact that no skin incisions were used, the area around the bladder incisions are raw and covered with scabs to promote healing and prevent bleeding. Certain precautions are needed to insure that the scabs are not disturbed over the next 2-4 weeks while the healing proceeds.  Because the raw surface inside your bladder and the irritating effects of urine you may expect frequency of urination and/or urgency (a stronger desire to urinate) and perhaps even getting up at night more often. This will usually resolve or improve slowly over the healing period. You may see some blood in your urine over the first 6 weeks. Do not be alarmed, even if the urine was clear for a while. Get off your feet and drink lots of fluids until clearing occurs. If you start to pass clots or don't improve call us.  Catheter: (If you are discharged with a catheter.)  1. Keep your catheter secured to your leg at all times with tape or the supplied strap. 2. You may experience leakage of urine around your catheter- as long as the  catheter continues to drain, this is normal.  If your catheter stops draining  go to the ER. 3. You may also have blood in your urine, even after it has been clear for  several days; you may even pass some small blood clots or other material.  This  is normal as well.  If this happens, sit down and drink plenty of water to help  make urine to flush out your bladder.  If the blood in your urine becomes worse  after doing this, contact our office or return to the ER. 4. You may use the leg bag (small bag) during the day, but use the large bag at  night.  Diet:  You may return to your normal diet immediately. Because of the raw surface of your bladder, alcohol, spicy foods, foods high in acid and drinks with caffeine may  cause irritation or frequency and should be used in moderation. To keep your urine flowing freely and avoid constipation, drink plenty of fluids during the day (8-10 glasses). Tip: Avoid cranberry juice because it is very acidic.  Activity:  Your physical activity doesn't need to be restricted. However, if you are very active, you may see some blood in the urine. We suggest that you reduce your activity under the circumstances until the bleeding has stopped.  Bowels:  It is important to keep your bowels regular during the postoperative period. Straining with bowel movements can cause bleeding. A bowel movement every other day is reasonable. Use a mild laxative if needed, such as milk of magnesia 2-3 tablespoons, or 2 Dulcolax tablets. Call if you continue to have problems. If you had been taking narcotics for pain, before, during or after your surgery, you may be constipated. Take a laxative if necessary.    Medication:  You should resume your pre-surgery medications unless told not to. In addition you may be given an antibiotic to prevent or treat infection. Antibiotics are not always necessary. All medication should be taken as prescribed until the bottles are finished unless you are having an unusual reaction to one of the drugs.   

## 2023-04-09 NOTE — Interval H&P Note (Signed)
 History and Physical Interval Note:  04/09/2023 12:55 PM  [facility] [name]  has presented today for surgery, with the diagnosis of BLADDER TUMOR.  The various methods of treatment have been discussed with the patient and family. After consideration of risks, benefits and other options for treatment, the patient has consented to  Procedure(s) with comments: TURBT (TRANSURETHRAL RESECTION OF BLADDER TUMOR) (N/A) - TURBT (TRANSURETHRAL RESECTION OF BLADDER TUMOR) WITH INTERVESICAL GEMCITABINE TURBT, WITH CHEMOTHERAPEUTIC AGENT INSTILLATION INTO BLADDER (N/A) as a surgical intervention.  The patient's history has been reviewed, patient examined, no change in status, stable for surgery.  I have reviewed the patient's chart and labs.  Questions were answered to the patient's satisfaction.     Mckaylee Dimalanta D Henrique Parekh

## 2023-04-09 NOTE — Anesthesia Procedure Notes (Signed)
 Procedure Name: Intubation Date/Time: 04/09/2023 1:51 PM  Performed by: Garth Bigness, CRNAPre-anesthesia Checklist: Patient identified, Emergency Drugs available, Suction available and Patient being monitored Patient Re-evaluated:Patient Re-evaluated prior to induction Oxygen Delivery Method: Circle system utilized Preoxygenation: Pre-oxygenation with 100% oxygen Induction Type: IV induction Ventilation: Mask ventilation without difficulty Laryngoscope Size: Mac and 4 Grade View: Grade II Tube type: Oral Number of attempts: 1 Airway Equipment and Method: Stylet and Oral airway Placement Confirmation: ETT inserted through vocal cords under direct vision, positive ETCO2 and breath sounds checked- equal and bilateral Secured at: 23 cm Tube secured with: Tape Dental Injury: Teeth and Oropharynx as per pre-operative assessment

## 2023-04-09 NOTE — Anesthesia Postprocedure Evaluation (Signed)
 Anesthesia Post Note  Patient: Adin Laker Janine Limbo  Procedure(s) Performed: TURBT (TRANSURETHRAL RESECTION OF BLADDER TUMOR) (Bladder) TURBT, WITH CHEMOTHERAPEUTIC AGENT INSTILLATION INTO BLADDER (Bladder)     Patient location during evaluation: PACU Anesthesia Type: General Level of consciousness: awake and alert, oriented and patient cooperative Pain management: pain level controlled Vital Signs Assessment: post-procedure vital signs reviewed and stable Respiratory status: spontaneous breathing, nonlabored ventilation and respiratory function stable Cardiovascular status: blood pressure returned to baseline and stable Postop Assessment: no apparent nausea or vomiting Anesthetic complications: no   No notable events documented.  Last Vitals:  Vitals:   04/09/23 1435 04/09/23 1445  BP: 126/76 130/79  Pulse: 78 83  Resp: 14 16  Temp: 36.8 C   SpO2: 100% 100%    Last Pain:  Vitals:   04/09/23 1435  TempSrc:   PainSc: Asleep                 Lannie Fields

## 2023-04-09 NOTE — Transfer of Care (Signed)
 Immediate Anesthesia Transfer of Care Note  Patient: Todd Barnes Janine Limbo  Procedure(s) Performed: TURBT (TRANSURETHRAL RESECTION OF BLADDER TUMOR) (Bladder) TURBT, WITH CHEMOTHERAPEUTIC AGENT INSTILLATION INTO BLADDER (Bladder)  Patient Location: PACU  Anesthesia Type:General  Level of Consciousness: awake, alert , oriented, and patient cooperative  Airway & Oxygen Therapy: Patient Spontanous Breathing and Patient connected to face mask oxygen  Post-op Assessment: Report given to RN and Post -op Vital signs reviewed and stable  Post vital signs: Reviewed and stable  Last Vitals:  Vitals Value Taken Time  BP 126/76 04/09/23 1434  Temp    Pulse 80 04/09/23 1437  Resp 12 04/09/23 1437  SpO2 100 % 04/09/23 1437  Vitals shown include unfiled device data.  Last Pain:  Vitals:   04/09/23 1137  TempSrc: Oral         Complications: No notable events documented.

## 2023-04-10 ENCOUNTER — Encounter (HOSPITAL_COMMUNITY): Payer: Self-pay | Admitting: Urology

## 2023-04-11 LAB — SURGICAL PATHOLOGY

## 2025-11-06 ENCOUNTER — Ambulatory Visit: Payer: Self-pay | Admitting: Cardiovascular Disease
